# Patient Record
Sex: Female | Born: 1955
Health system: Southern US, Community
[De-identification: ages and names within clinical notes are randomized; demographics above are authoritative.]

## PROBLEM LIST (undated history)

## (undated) DIAGNOSIS — Z720 Tobacco use: Secondary | ICD-10-CM

## (undated) DIAGNOSIS — I10 Essential (primary) hypertension: Secondary | ICD-10-CM

## (undated) DIAGNOSIS — F1910 Other psychoactive substance abuse, uncomplicated: Secondary | ICD-10-CM

## (undated) HISTORY — DX: Tobacco use: Z72.0

## (undated) HISTORY — DX: Essential (primary) hypertension: I10

## (undated) HISTORY — DX: Other psychoactive substance abuse, uncomplicated: F19.10

---

## 2018-01-24 ENCOUNTER — Encounter (HOSPITAL_COMMUNITY): Payer: Self-pay

## 2018-01-24 ENCOUNTER — Emergency Department (HOSPITAL_COMMUNITY)
Admission: EM | Admit: 2018-01-24 | Discharge: 2018-01-24 | Disposition: A | Discharge status: Home / Self Care | Payer: Medicare Other | Attending: Emergency Medicine | Admitting: Emergency Medicine

## 2018-01-24 DIAGNOSIS — F1910 Other psychoactive substance abuse, uncomplicated: Secondary | ICD-10-CM

## 2018-01-24 DIAGNOSIS — I10 Essential (primary) hypertension: Secondary | ICD-10-CM | POA: Diagnosis present

## 2018-01-24 DIAGNOSIS — N179 Acute kidney failure, unspecified: Secondary | ICD-10-CM | POA: Insufficient documentation

## 2018-01-24 DIAGNOSIS — F17200 Nicotine dependence, unspecified, uncomplicated: Secondary | ICD-10-CM | POA: Insufficient documentation

## 2018-01-24 DIAGNOSIS — I159 Secondary hypertension, unspecified: Secondary | ICD-10-CM | POA: Insufficient documentation

## 2018-01-24 LAB — COMPREHENSIVE METABOLIC PANEL
ALK PHOS: 77 U/L (ref 38–126)
ALT: 12 U/L — ABNORMAL LOW (ref 14–54)
ANION GAP: 11 (ref 5–15)
AST: 16 U/L (ref 15–41)
Albumin: 3.9 g/dL (ref 3.5–5.0)
BILIRUBIN TOTAL: 0.6 mg/dL (ref 0.3–1.2)
BUN: 10 mg/dL (ref 6–20)
CO2: 22 mmol/L (ref 22–32)
Calcium: 9.5 mg/dL (ref 8.9–10.3)
Chloride: 107 mmol/L (ref 101–111)
Creatinine, Ser: 1.16 mg/dL — ABNORMAL HIGH (ref 0.44–1.00)
GFR, EST AFRICAN AMERICAN: 57 mL/min — AB (ref >60)
GFR, EST NON AFRICAN AMERICAN: 49 mL/min — AB (ref >60)
Glucose, Bld: 89 mg/dL (ref 65–99)
Potassium: 3.7 mmol/L (ref 3.5–5.1)
SODIUM: 140 mmol/L (ref 135–145)
TOTAL PROTEIN: 7.5 g/dL (ref 6.5–8.1)

## 2018-01-24 LAB — CBC
HCT: 41.7 % (ref 36.0–46.0)
HEMOGLOBIN: 13.5 g/dL (ref 12.0–15.0)
MCH: 29.7 pg (ref 26.0–34.0)
MCHC: 32.4 g/dL (ref 30.0–36.0)
MCV: 91.6 fL (ref 78.0–100.0)
PLATELETS: 257 10*3/uL (ref 150–400)
RBC: 4.55 MIL/uL (ref 3.87–5.11)
RDW: 14 % (ref 11.5–15.5)
WBC: 4.6 10*3/uL (ref 4.0–10.5)

## 2018-01-24 MED ORDER — HYDROCHLOROTHIAZIDE 25 MG PO TABS: 25.0000 mg | ORAL_TABLET | Freq: Every day | ORAL | 0 refills | Status: DC

## 2018-01-24 MED ORDER — HYDROCHLOROTHIAZIDE 25 MG PO TABS
25.0000 mg | ORAL_TABLET | Freq: Every day | ORAL | Status: DC
  Administered 2018-01-24: 25 mg via ORAL
  Filled 2018-01-24: qty 1

## 2018-01-24 NOTE — Discharge Instructions (Signed)
If you do not have a primary care physician then it is very important that you develop a relationship with one.  Please contact [facility] at [phone] for a referral to many excellent primary care physicians in the community.

## 2018-01-24 NOTE — ED Triage Notes (Signed)
Patient states that she was sent here from rehab facility due to HTN. No complaints. States that she uses crack and ETOH daily. No CP, no SOB

## 2018-01-24 NOTE — ED Provider Notes (Signed)
[facility] Provider Note  CSN: [id number] Arrival date & time: 01/24/18 1013  Chief Complaint(s) No chief complaint on file.  HPI [facility] is a 62 y.o. female with a history of polysubstance abuse presents to the emergency department for hypertension.  [facility] after she was trying to be admitted for rehab when they noted the elevated blood pressure.  She denies any known history of hypertension.  She is currently asymptomatic and denies any headache, vision loss, dizziness, chest pain, shortness of breath, trouble voiding.  She denies any recent fevers or infections.  Denies any other physical complaints.  HPI  Past Medical History History reviewed. No pertinent past medical history. There are no active problems to display for this patient.  Home Medication(s) Prior to Admission medications   Medication Sig Start Date End Date Taking? Authorizing Provider  hydrochlorothiazide (HYDRODIURIL) 25 MG tablet Take 1 tablet (25 mg total) by mouth daily. 01/24/18 02/23/18  Fatima Blank, MD                                                                                                                                    Past Surgical History History reviewed. No pertinent surgical history. Family History No family history on file.  Social History Social History   Tobacco Use  . Smoking status: Current Every Day Smoker  . Smokeless tobacco: Never Used  Substance Use Topics  . Alcohol use: Yes  . Drug use: Yes    Types: Cocaine, Marijuana   Allergies Patient has no known allergies.  Review of Systems Review of Systems All other systems are reviewed and are negative for acute change except as noted in the HPI  Physical Exam Vital Signs  I have reviewed the triage vital signs BP (!) 185/95   Pulse 71   Temp 98.3 F (36.8 C) (Oral)   Resp 16   SpO2 98%  All other systems are reviewed and are negative for  acute change except as noted in the HPI  Physical Exam  Constitutional: She is oriented to person, place, and time. She appears well-developed and well-nourished. No distress.  HENT:  Head: Normocephalic and atraumatic.  Nose: Nose normal.  Eyes: Conjunctivae and EOM are normal. Pupils are equal, round, and reactive to light. Right eye exhibits no discharge. Left eye exhibits no discharge. No scleral icterus.  Neck: Normal range of motion. Neck supple.  Cardiovascular: Normal rate. A regularly irregular rhythm present. Exam reveals no gallop and no friction rub.  No murmur heard. Pulmonary/Chest: Effort normal and breath sounds normal. No stridor. No respiratory distress. She has no rales.  Abdominal: Soft. She exhibits no distension. There is no tenderness.  Musculoskeletal: She exhibits no edema or tenderness.  Neurological: She is alert and oriented to person, place, and time.  Skin: Skin is warm and dry. No rash noted. She is not diaphoretic.  No erythema.  Psychiatric: She has a normal mood and affect.  Vitals reviewed.   ED Results and Treatments Labs (all labs ordered are listed, but only abnormal results are displayed) Labs Reviewed  COMPREHENSIVE METABOLIC PANEL - Abnormal; Notable for the following components:      Result Value   Creatinine, Ser 1.16 (*)    ALT 12 (*)    GFR calc non Af Amer 49 (*)    GFR calc Af Amer 57 (*)    All other components within normal limits  CBC                                                                                                                         EKG  EKG Interpretation  Date/Time:  Monday January 24 2018 12:21:08 EST Ventricular Rate:  58 PR Interval:    QRS Duration: 110 QT Interval:  448 QTC Calculation: 440 R Axis:   -65 Text Interpretation:  Sinus rhythm Atrial premature complexes Left atrial enlargement Incomplete RBBB and LAFB NO STEMI No old tracing to compare Confirmed by Addison Lank [phone]) on 01/24/2018  12:39:30 PM      Radiology No results found. Pertinent labs & imaging results that were available during my care of the patient were reviewed by me and considered in my medical decision making (see chart for details).  Medications Ordered in ED Medications  hydrochlorothiazide (HYDRODIURIL) tablet 25 mg (25 mg Oral Given 01/24/18 1219)                                                                                                                                    Procedures Procedures  (including critical care time)  Medical Decision Making / ED Course I have reviewed the nursing notes for this encounter and the patient's prior records (if available in EHR or on provided paperwork).    Asymptomatic hypertension.  Labs drawn in triage revealed mild renal insufficiency.  No priors for comparison.  On exam patient had notable irregularly irregular heart rhythm thus an EKG was obtained revealing frequent PACs.  Otherwise no ischemic changes or acute dysrhythmias.  No evidence suggesting significant endorgan damage from hypertension.  We will start the patient on HCTZ instead of lisinopril given the renal insufficiency.  Patient does have Medicare/Medicaid that she was instructed to establish care with a primary care provider for continued management of her hypertension.  The  patient appears reasonably screened and/or stabilized for discharge and I doubt any other medical condition or other [facility] requiring further screening, evaluation, or treatment in the ED at this time prior to discharge.  The patient is safe for discharge with strict return precautions.   Final Clinical Impression(s) / ED Diagnoses Final diagnoses:  Secondary hypertension  Substance abuse ([place])  AKI (acute kidney injury) ([place])   Disposition: Discharge  Condition: Good  I have discussed the results, Dx and Tx plan with the patient who expressed understanding and agree(s) with the plan. Discharge instructions  discussed at great length. The patient was given strict return precautions who verbalized understanding of the instructions. No further questions at time of discharge.    ED Discharge Orders        Ordered    hydrochlorothiazide (HYDRODIURIL) 25 MG tablet  Daily     01/24/18 1303       Follow Up: Primary care provider   Please establish care for continued management of your hypertension      This chart was dictated using voice recognition software.  Despite best efforts to proofread,  errors can occur which can change the documentation meaning.   Fatima Blank, MD 01/24/18 1304

## 2018-05-30 DIAGNOSIS — F10120 Alcohol abuse with intoxication, uncomplicated: Secondary | ICD-10-CM | POA: Diagnosis not present

## 2018-06-03 DIAGNOSIS — F1020 Alcohol dependence, uncomplicated: Secondary | ICD-10-CM | POA: Diagnosis not present

## 2018-06-03 DIAGNOSIS — I10 Essential (primary) hypertension: Secondary | ICD-10-CM | POA: Diagnosis not present

## 2018-06-03 DIAGNOSIS — F1290 Cannabis use, unspecified, uncomplicated: Secondary | ICD-10-CM | POA: Diagnosis not present

## 2018-06-03 DIAGNOSIS — F1490 Cocaine use, unspecified, uncomplicated: Secondary | ICD-10-CM | POA: Diagnosis not present

## 2018-06-03 DIAGNOSIS — F10120 Alcohol abuse with intoxication, uncomplicated: Secondary | ICD-10-CM | POA: Diagnosis not present

## 2018-06-07 DIAGNOSIS — F1020 Alcohol dependence, uncomplicated: Secondary | ICD-10-CM | POA: Diagnosis not present

## 2018-06-10 DIAGNOSIS — F1020 Alcohol dependence, uncomplicated: Secondary | ICD-10-CM | POA: Diagnosis not present

## 2018-06-13 DIAGNOSIS — F1020 Alcohol dependence, uncomplicated: Secondary | ICD-10-CM | POA: Diagnosis not present

## 2018-06-16 DIAGNOSIS — F10120 Alcohol abuse with intoxication, uncomplicated: Secondary | ICD-10-CM | POA: Diagnosis not present

## 2018-06-20 DIAGNOSIS — F1020 Alcohol dependence, uncomplicated: Secondary | ICD-10-CM | POA: Diagnosis not present

## 2018-06-27 DIAGNOSIS — F10120 Alcohol abuse with intoxication, uncomplicated: Secondary | ICD-10-CM | POA: Diagnosis not present

## 2018-07-01 DIAGNOSIS — F1020 Alcohol dependence, uncomplicated: Secondary | ICD-10-CM | POA: Diagnosis not present

## 2018-07-05 DIAGNOSIS — F10120 Alcohol abuse with intoxication, uncomplicated: Secondary | ICD-10-CM | POA: Diagnosis not present

## 2018-07-08 DIAGNOSIS — F1020 Alcohol dependence, uncomplicated: Secondary | ICD-10-CM | POA: Diagnosis not present

## 2018-07-11 DIAGNOSIS — F1020 Alcohol dependence, uncomplicated: Secondary | ICD-10-CM | POA: Diagnosis not present

## 2018-07-20 DIAGNOSIS — F1020 Alcohol dependence, uncomplicated: Secondary | ICD-10-CM | POA: Diagnosis not present

## 2018-07-22 DIAGNOSIS — F1220 Cannabis dependence, uncomplicated: Secondary | ICD-10-CM | POA: Diagnosis not present

## 2018-07-25 DIAGNOSIS — F1220 Cannabis dependence, uncomplicated: Secondary | ICD-10-CM | POA: Diagnosis not present

## 2018-07-29 DIAGNOSIS — F10120 Alcohol abuse with intoxication, uncomplicated: Secondary | ICD-10-CM | POA: Diagnosis not present

## 2018-07-29 DIAGNOSIS — F12120 Cannabis abuse with intoxication, uncomplicated: Secondary | ICD-10-CM | POA: Diagnosis not present

## 2018-08-01 DIAGNOSIS — F10120 Alcohol abuse with intoxication, uncomplicated: Secondary | ICD-10-CM | POA: Diagnosis not present

## 2018-08-01 DIAGNOSIS — F12120 Cannabis abuse with intoxication, uncomplicated: Secondary | ICD-10-CM | POA: Diagnosis not present

## 2018-08-10 DIAGNOSIS — F1220 Cannabis dependence, uncomplicated: Secondary | ICD-10-CM | POA: Diagnosis not present

## 2018-08-12 DIAGNOSIS — F1220 Cannabis dependence, uncomplicated: Secondary | ICD-10-CM | POA: Diagnosis not present

## 2018-08-12 DIAGNOSIS — F1020 Alcohol dependence, uncomplicated: Secondary | ICD-10-CM | POA: Diagnosis not present

## 2018-08-19 DIAGNOSIS — F1220 Cannabis dependence, uncomplicated: Secondary | ICD-10-CM | POA: Diagnosis not present

## 2018-08-19 DIAGNOSIS — F1020 Alcohol dependence, uncomplicated: Secondary | ICD-10-CM | POA: Diagnosis not present

## 2018-08-22 DIAGNOSIS — F12120 Cannabis abuse with intoxication, uncomplicated: Secondary | ICD-10-CM | POA: Diagnosis not present

## 2018-08-22 DIAGNOSIS — F10120 Alcohol abuse with intoxication, uncomplicated: Secondary | ICD-10-CM | POA: Diagnosis not present

## 2018-08-24 DIAGNOSIS — F1411 Cocaine abuse, in remission: Secondary | ICD-10-CM | POA: Diagnosis not present

## 2018-08-24 DIAGNOSIS — I10 Essential (primary) hypertension: Secondary | ICD-10-CM | POA: Diagnosis not present

## 2018-08-24 DIAGNOSIS — Z1211 Encounter for screening for malignant neoplasm of colon: Secondary | ICD-10-CM | POA: Diagnosis not present

## 2018-08-24 DIAGNOSIS — R634 Abnormal weight loss: Secondary | ICD-10-CM | POA: Diagnosis not present

## 2018-08-24 DIAGNOSIS — Z139 Encounter for screening, unspecified: Secondary | ICD-10-CM | POA: Diagnosis not present

## 2018-08-24 DIAGNOSIS — Z1231 Encounter for screening mammogram for malignant neoplasm of breast: Secondary | ICD-10-CM | POA: Diagnosis not present

## 2018-08-24 DIAGNOSIS — F1220 Cannabis dependence, uncomplicated: Secondary | ICD-10-CM | POA: Diagnosis not present

## 2018-08-24 DIAGNOSIS — Z13228 Encounter for screening for other metabolic disorders: Secondary | ICD-10-CM | POA: Diagnosis not present

## 2018-08-24 DIAGNOSIS — Z13220 Encounter for screening for lipoid disorders: Secondary | ICD-10-CM | POA: Diagnosis not present

## 2018-08-24 DIAGNOSIS — F1421 Cocaine dependence, in remission: Secondary | ICD-10-CM | POA: Diagnosis not present

## 2018-08-24 LAB — HEPATIC FUNCTION PANEL
ALT: 13 (ref 7–35)
AST: 18 (ref 13–35)
Alkaline Phosphatase: 86 (ref 25–125)
Bilirubin, Total: 0.4

## 2018-08-24 LAB — BASIC METABOLIC PANEL
BUN: 14 (ref 4–21)
Creatinine: 1 (ref 0.5–1.1)
GLUCOSE: 88
Potassium: 4.5 (ref 3.4–5.3)
SODIUM: 141 (ref 137–147)

## 2018-08-24 LAB — CBC AND DIFFERENTIAL
HEMATOCRIT: 41 (ref 36–46)
HEMOGLOBIN: 13.5 (ref 12.0–16.0)
Platelets: 286 (ref 150–399)
WBC: 4.4

## 2018-08-24 LAB — HEMOGLOBIN A1C: Hemoglobin A1C: 5.9

## 2018-08-24 LAB — LIPID PANEL
CHOLESTEROL: 222 — AB (ref 0–200)
HDL: 70 (ref 35–70)
LDL Cholesterol: 127
LDL/HDL RATIO: 1.8
TRIGLYCERIDES: 127 (ref 40–160)

## 2018-08-24 LAB — TSH: TSH: 1.18 (ref 0.41–5.90)

## 2018-08-26 DIAGNOSIS — F1220 Cannabis dependence, uncomplicated: Secondary | ICD-10-CM | POA: Diagnosis not present

## 2018-09-05 DIAGNOSIS — F1020 Alcohol dependence, uncomplicated: Secondary | ICD-10-CM | POA: Diagnosis not present

## 2018-09-09 DIAGNOSIS — F10120 Alcohol abuse with intoxication, uncomplicated: Secondary | ICD-10-CM | POA: Diagnosis not present

## 2018-09-09 DIAGNOSIS — F12120 Cannabis abuse with intoxication, uncomplicated: Secondary | ICD-10-CM | POA: Diagnosis not present

## 2018-09-14 ENCOUNTER — Encounter: Payer: Self-pay | Admitting: Nurse Practitioner

## 2018-09-14 DIAGNOSIS — Z1211 Encounter for screening for malignant neoplasm of colon: Secondary | ICD-10-CM | POA: Diagnosis not present

## 2018-09-14 DIAGNOSIS — R634 Abnormal weight loss: Secondary | ICD-10-CM | POA: Diagnosis not present

## 2018-09-14 DIAGNOSIS — I4510 Unspecified right bundle-branch block: Secondary | ICD-10-CM | POA: Diagnosis not present

## 2018-09-14 DIAGNOSIS — F1411 Cocaine abuse, in remission: Secondary | ICD-10-CM

## 2018-09-14 DIAGNOSIS — R630 Anorexia: Secondary | ICD-10-CM | POA: Diagnosis not present

## 2018-09-14 DIAGNOSIS — Z13228 Encounter for screening for other metabolic disorders: Secondary | ICD-10-CM

## 2018-09-14 DIAGNOSIS — Z13220 Encounter for screening for lipoid disorders: Secondary | ICD-10-CM

## 2018-09-14 DIAGNOSIS — Z1231 Encounter for screening mammogram for malignant neoplasm of breast: Secondary | ICD-10-CM

## 2018-09-14 DIAGNOSIS — I10 Essential (primary) hypertension: Secondary | ICD-10-CM | POA: Diagnosis not present

## 2018-09-14 DIAGNOSIS — Z720 Tobacco use: Secondary | ICD-10-CM

## 2018-09-14 DIAGNOSIS — F1220 Cannabis dependence, uncomplicated: Secondary | ICD-10-CM

## 2018-09-16 DIAGNOSIS — F12120 Cannabis abuse with intoxication, uncomplicated: Secondary | ICD-10-CM | POA: Diagnosis not present

## 2018-09-16 DIAGNOSIS — F10120 Alcohol abuse with intoxication, uncomplicated: Secondary | ICD-10-CM | POA: Diagnosis not present

## 2018-09-19 DIAGNOSIS — F12120 Cannabis abuse with intoxication, uncomplicated: Secondary | ICD-10-CM | POA: Diagnosis not present

## 2018-09-19 DIAGNOSIS — F10120 Alcohol abuse with intoxication, uncomplicated: Secondary | ICD-10-CM | POA: Diagnosis not present

## 2018-09-22 DIAGNOSIS — F1220 Cannabis dependence, uncomplicated: Secondary | ICD-10-CM | POA: Diagnosis not present

## 2018-09-22 DIAGNOSIS — F1020 Alcohol dependence, uncomplicated: Secondary | ICD-10-CM | POA: Diagnosis not present

## 2018-09-26 DIAGNOSIS — F1220 Cannabis dependence, uncomplicated: Secondary | ICD-10-CM | POA: Diagnosis not present

## 2018-09-26 DIAGNOSIS — F1020 Alcohol dependence, uncomplicated: Secondary | ICD-10-CM | POA: Diagnosis not present

## 2018-09-27 ENCOUNTER — Encounter: Payer: Self-pay | Admitting: Nurse Practitioner

## 2018-09-27 ENCOUNTER — Ambulatory Visit (INDEPENDENT_AMBULATORY_CARE_PROVIDER_SITE_OTHER): Payer: Medicare HMO | Admitting: Nurse Practitioner

## 2018-09-27 VITALS — BP 124/80 | HR 59 | Temp 98.1°F | Ht 69.0 in | Wt 134.0 lb

## 2018-09-27 DIAGNOSIS — I499 Cardiac arrhythmia, unspecified: Secondary | ICD-10-CM | POA: Diagnosis not present

## 2018-09-27 DIAGNOSIS — I10 Essential (primary) hypertension: Secondary | ICD-10-CM | POA: Diagnosis not present

## 2018-09-27 MED ORDER — ASPIRIN 81 MG PO CHEW: 81.0000 mg | CHEWABLE_TABLET | Freq: Every day | ORAL | 11 refills | Status: DC

## 2018-09-27 NOTE — Patient Instructions (Signed)
DASH Eating Plan DASH stands for "Dietary Approaches to Stop Hypertension." The DASH eating plan is a healthy eating plan that has been shown to reduce high blood pressure (hypertension). It may also reduce your risk for type 2 diabetes, heart disease, and stroke. The DASH eating plan may also help with weight loss. What are tips for following this plan? General guidelines  Avoid eating more than 2,300 mg (milligrams) of salt (sodium) a day. If you have hypertension, you may need to reduce your sodium intake to 1,500 mg a day.  Limit alcohol intake to no more than 1 drink a day for nonpregnant women and 2 drinks a day for men. One drink equals 12 oz of beer, 5 oz of wine, or 1 oz of hard liquor.  Work with your health care provider to maintain a healthy body weight or to lose weight. Ask what an ideal weight is for you.  Get at least 30 minutes of exercise that causes your heart to beat faster (aerobic exercise) most days of the week. Activities may include walking, swimming, or biking.  Work with your health care provider or diet and nutrition specialist (dietitian) to adjust your eating plan to your individual calorie needs. Reading food labels  Check food labels for the amount of sodium per serving. Choose foods with less than 5 percent of the Daily Value of sodium. Generally, foods with less than 300 mg of sodium per serving fit into this eating plan.  To find whole grains, look for the word "whole" as the first word in the ingredient list. Shopping  Buy products labeled as "low-sodium" or "no salt added."  Buy fresh foods. Avoid canned foods and premade or frozen meals. Cooking  Avoid adding salt when cooking. Use salt-free seasonings or herbs instead of table salt or sea salt. Check with your health care provider or pharmacist before using salt substitutes.  Do not fry foods. Cook foods using healthy methods such as baking, boiling, grilling, and broiling instead.  Cook with  heart-healthy oils, such as olive, canola, soybean, or sunflower oil. Meal planning   Eat a balanced diet that includes: ? 5 or more servings of fruits and vegetables each day. At each meal, try to fill half of your plate with fruits and vegetables. ? Up to 6-8 servings of whole grains each day. ? Less than 6 oz of lean meat, poultry, or fish each day. A 3-oz serving of meat is about the same size as a deck of cards. One egg equals 1 oz. ? 2 servings of low-fat dairy each day. ? A serving of nuts, seeds, or beans 5 times each week. ? Heart-healthy fats. Healthy fats called Omega-3 fatty acids are found in foods such as flaxseeds and coldwater fish, like sardines, salmon, and mackerel.  Limit how much you eat of the following: ? Canned or prepackaged foods. ? Food that is high in trans fat, such as fried foods. ? Food that is high in saturated fat, such as fatty meat. ? Sweets, desserts, sugary drinks, and other foods with added sugar. ? Full-fat dairy products.  Do not salt foods before eating.  Try to eat at least 2 vegetarian meals each week.  Eat more home-cooked food and less restaurant, buffet, and fast food.  When eating at a restaurant, ask that your food be prepared with less salt or no salt, if possible. What foods are recommended? The items listed may not be a complete list. Talk with your dietitian about what   dietary choices are best for you. Grains Whole-grain or whole-wheat bread. Whole-grain or whole-wheat pasta. Brown rice. Oatmeal. Quinoa. Bulgur. Whole-grain and low-sodium cereals. Pita bread. Low-fat, low-sodium crackers. Whole-wheat flour tortillas. Vegetables Fresh or frozen vegetables (raw, steamed, roasted, or grilled). Low-sodium or reduced-sodium tomato and vegetable juice. Low-sodium or reduced-sodium tomato sauce and tomato paste. Low-sodium or reduced-sodium canned vegetables. Fruits All fresh, dried, or frozen fruit. Canned fruit in natural juice (without  added sugar). Meat and other protein foods Skinless chicken or turkey. Ground chicken or turkey. Pork with fat trimmed off. Fish and seafood. Egg whites. Dried beans, peas, or lentils. Unsalted nuts, nut butters, and seeds. Unsalted canned beans. Lean cuts of beef with fat trimmed off. Low-sodium, lean deli meat. Dairy Low-fat (1%) or fat-free (skim) milk. Fat-free, low-fat, or reduced-fat cheeses. Nonfat, low-sodium ricotta or cottage cheese. Low-fat or nonfat yogurt. Low-fat, low-sodium cheese. Fats and oils Soft margarine without trans fats. Vegetable oil. Low-fat, reduced-fat, or light mayonnaise and salad dressings (reduced-sodium). Canola, safflower, olive, soybean, and sunflower oils. Avocado. Seasoning and other foods Herbs. Spices. Seasoning mixes without salt. Unsalted popcorn and pretzels. Fat-free sweets. What foods are not recommended? The items listed may not be a complete list. Talk with your dietitian about what dietary choices are best for you. Grains Baked goods made with fat, such as croissants, muffins, or some breads. Dry pasta or rice meal packs. Vegetables Creamed or fried vegetables. Vegetables in a cheese sauce. Regular canned vegetables (not low-sodium or reduced-sodium). Regular canned tomato sauce and paste (not low-sodium or reduced-sodium). Regular tomato and vegetable juice (not low-sodium or reduced-sodium). Pickles. Olives. Fruits Canned fruit in a light or heavy syrup. Fried fruit. Fruit in cream or butter sauce. Meat and other protein foods Fatty cuts of meat. Ribs. Fried meat. Bacon. Sausage. Bologna and other processed lunch meats. Salami. Fatback. Hotdogs. Bratwurst. Salted nuts and seeds. Canned beans with added salt. Canned or smoked fish. Whole eggs or egg yolks. Chicken or turkey with skin. Dairy Whole or 2% milk, cream, and half-and-half. Whole or full-fat cream cheese. Whole-fat or sweetened yogurt. Full-fat cheese. Nondairy creamers. Whipped toppings.  Processed cheese and cheese spreads. Fats and oils Butter. Stick margarine. Lard. Shortening. Ghee. Bacon fat. Tropical oils, such as coconut, palm kernel, or palm oil. Seasoning and other foods Salted popcorn and pretzels. Onion salt, garlic salt, seasoned salt, table salt, and sea salt. Worcestershire sauce. Tartar sauce. Barbecue sauce. Teriyaki sauce. Soy sauce, including reduced-sodium. Steak sauce. Canned and packaged gravies. Fish sauce. Oyster sauce. Cocktail sauce. Horseradish that you find on the shelf. Ketchup. Mustard. Meat flavorings and tenderizers. Bouillon cubes. Hot sauce and Tabasco sauce. Premade or packaged marinades. Premade or packaged taco seasonings. Relishes. Regular salad dressings. Where to find more information:  National Heart, Lung, and Blood Institute: www.nhlbi.nih.gov  American Heart Association: www.heart.org Summary  The DASH eating plan is a healthy eating plan that has been shown to reduce high blood pressure (hypertension). It may also reduce your risk for type 2 diabetes, heart disease, and stroke.  With the DASH eating plan, you should limit salt (sodium) intake to 2,300 mg a day. If you have hypertension, you may need to reduce your sodium intake to 1,500 mg a day.  When on the DASH eating plan, aim to eat more fresh fruits and vegetables, whole grains, lean proteins, low-fat dairy, and heart-healthy fats.  Work with your health care provider or diet and nutrition specialist (dietitian) to adjust your eating plan to your individual   calorie needs. This information is not intended to replace advice given to you by your health care provider. Make sure you discuss any questions you have with your health care provider. Document Released: 12/03/2011 Document Revised: 12/07/2016 Document Reviewed: 12/07/2016 Elsevier Interactive Patient Education  2018 Elsevier Inc.  

## 2018-09-27 NOTE — Progress Notes (Signed)
   Subjective:    Patient ID: Deanna [facility], female    DOB: 1956-08-29, 62 y.o.   MRN: [id number]  Hypertension  This is a chronic problem. The problem has been gradually improving since onset. The problem is controlled. There are no associated agents to hypertension. Risk factors for coronary artery disease include smoking/tobacco exposure and sedentary lifestyle. The current treatment provides significant improvement. There are no compliance problems.       Review of Systems  Constitutional: Negative.   Eyes: Negative.   Respiratory: Negative.   Cardiovascular: Negative.   Neurological: Negative.   Psychiatric/Behavioral: Negative.        Objective:   Physical Exam  Constitutional: She appears well-developed and well-nourished.  Cardiovascular: Normal heart sounds and normal pulses. An irregular rhythm present.  Pulmonary/Chest: Effort normal and breath sounds normal.  Musculoskeletal: Normal range of motion.  Skin: Skin is warm.          Assessment & Plan:  Hypertension - chronic, controlled, continue current medications. She has an appointment with Cardiology.   Irregular Heart beat - she has an appointment with Cardiology this month for further evaluation.  Will advise her to take a baby aspirin 81mg  daily.

## 2018-09-28 ENCOUNTER — Ambulatory Visit: Payer: Medicare HMO | Admitting: Nurse Practitioner

## 2018-09-30 DIAGNOSIS — F10120 Alcohol abuse with intoxication, uncomplicated: Secondary | ICD-10-CM | POA: Diagnosis not present

## 2018-09-30 DIAGNOSIS — F12120 Cannabis abuse with intoxication, uncomplicated: Secondary | ICD-10-CM | POA: Diagnosis not present

## 2018-10-03 DIAGNOSIS — F1220 Cannabis dependence, uncomplicated: Secondary | ICD-10-CM | POA: Diagnosis not present

## 2018-10-03 DIAGNOSIS — F1020 Alcohol dependence, uncomplicated: Secondary | ICD-10-CM | POA: Diagnosis not present

## 2018-10-07 DIAGNOSIS — F10120 Alcohol abuse with intoxication, uncomplicated: Secondary | ICD-10-CM | POA: Diagnosis not present

## 2018-10-07 DIAGNOSIS — F12120 Cannabis abuse with intoxication, uncomplicated: Secondary | ICD-10-CM | POA: Diagnosis not present

## 2018-10-19 ENCOUNTER — Ambulatory Visit: Payer: Medicare HMO

## 2018-10-19 ENCOUNTER — Ambulatory Visit (INDEPENDENT_AMBULATORY_CARE_PROVIDER_SITE_OTHER): Payer: Medicare HMO | Admitting: Nurse Practitioner

## 2018-10-19 VITALS — BP 118/84 | HR 66 | Temp 97.6°F | Ht 67.5 in | Wt 133.6 lb

## 2018-10-19 DIAGNOSIS — R0789 Other chest pain: Secondary | ICD-10-CM

## 2018-10-19 DIAGNOSIS — Z0000 Encounter for general adult medical examination without abnormal findings: Secondary | ICD-10-CM | POA: Diagnosis not present

## 2018-10-19 DIAGNOSIS — R071 Chest pain on breathing: Secondary | ICD-10-CM

## 2018-10-19 MED ORDER — PREDNISONE 20 MG PO TABS: ORAL_TABLET | ORAL | 0 refills | Status: DC

## 2018-10-19 NOTE — Progress Notes (Signed)
  Subjective:     Patient ID: Deanna Harrell , female    DOB: 1956/10/31 , 62 y.o.   MRN: [id number]   Chest Pain   This is a new problem. The current episode started yesterday. The onset quality is sudden. The problem occurs constantly. The pain is present in the substernal region. The pain is at a severity of 2/10. The pain is mild. The quality of the pain is described as tightness. The pain does not radiate. Pertinent negatives include no abdominal pain, cough, dizziness, malaise/fatigue, palpitations, shortness of breath or sputum production. The pain is aggravated by deep breathing. She has tried rest for the symptoms. Risk factors include sedentary lifestyle, obesity and substance abuse (smoked marijuana yesterday). Prior diagnostic workup includes stress echo.     Past Medical History:  Diagnosis Date  . Hypertension       Current Outpatient Medications:  .  amLODipine (NORVASC) 2.5 MG tablet, Take 2.5 mg by mouth daily., Disp: , Rfl:  .  aspirin (ASPIRIN CHILDRENS) 81 MG chewable tablet, Chew 1 tablet (81 mg total) by mouth daily. (Patient not taking: Reported on 10/19/2018), Disp: 36 tablet, Rfl: 11 .  hydrochlorothiazide (HYDRODIURIL) 25 MG tablet, Take 1 tablet (25 mg total) by mouth daily., Disp: 30 tablet, Rfl: 0   No Known Allergies   Review of Systems  Constitutional: Negative for malaise/fatigue.  Respiratory: Negative for cough, sputum production and shortness of breath.   Cardiovascular: Positive for chest pain. Negative for palpitations.  Gastrointestinal: Negative for abdominal pain.  Neurological: Negative for dizziness.     There were no vitals filed for this visit. There is no height or weight on file to calculate BMI.   Objective:  Physical Exam  Constitutional: She appears well-developed and well-nourished.  Cardiovascular: Normal rate, regular rhythm, normal heart sounds and intact distal pulses.  Pulmonary/Chest: Effort normal and breath sounds normal.   Musculoskeletal:  Left 5 and 6th rib spaces with tenderness   Skin: Skin is warm and dry.        Assessment And Plan:     1. Costochondral chest pain  Tender to left 5th and 6th rib spaces  May use heating pad on low two times per day.  - predniSONE (DELTASONE) 20 MG tablet; Take 2 tabs by mouth daily  Dispense: 20 tablet; Refill: 0       Minette Brine, FNP

## 2018-10-19 NOTE — Patient Instructions (Signed)
Costochondritis Costochondritis is swelling and irritation (inflammation) of the tissue (cartilage) that connects your ribs to your breastbone (sternum). This causes pain in the front of your chest. Usually, the pain:  Starts gradually.  Is in more than one rib.  This condition usually goes away on its own over time. Follow these instructions at home:  Do not do anything that makes your pain worse.  If directed, put ice on the painful area: ? Put ice in a plastic bag. ? Place a towel between your skin and the bag. ? Leave the ice on for 20 minutes, 2-3 times a day.  If directed, put heat on the affected area as often as told by your doctor. Use the heat source that your doctor tells you to use, such as a moist heat pack or a heating pad. ? Place a towel between your skin and the heat source. ? Leave the heat on for 20-30 minutes. ? Take off the heat if your skin turns bright red. This is very important if you cannot feel pain, heat, or cold. You may have a greater risk of getting burned.  Take over-the-counter and prescription medicines only as told by your doctor.  Return to your normal activities as told by your doctor. Ask your doctor what activities are safe for you.  Keep all follow-up visits as told by your doctor. This is important. Contact a doctor if:  You have chills or a fever.  Your pain does not go away or it gets worse.  You have a cough that does not go away. Get help right away if:  You are short of breath. This information is not intended to replace advice given to you by your health care provider. Make sure you discuss any questions you have with your health care provider. Document Released: 06/01/2008 Document Revised: 07/03/2016 Document Reviewed: 04/08/2016 Elsevier Interactive Patient Education  2018 [employer] Inc.  

## 2018-10-19 NOTE — Patient Instructions (Addendum)
Deanna Harrell , Thank you for taking time to come for your Medicare Wellness Visit. I appreciate your ongoing commitment to your health goals. Please review the following plan we discussed and let me know if I can assist you in the future.   Screening recommendations/referrals: Colonoscopy: declines Mammogram: declines Bone Density: declines Recommended yearly ophthalmology/optometry visit for glaucoma screening and checkup Recommended yearly dental visit for hygiene and checkup  Vaccinations: Influenza vaccine: declines Pneumococcal vaccine: declines Tdap vaccine: declines Shingles vaccine: declines    Advanced directives: Advance directive discussed with you today. Even though you declined this today please call our office should you change your mind and we can give you the proper paperwork for you to fill out.   Conditions/risks identified: Chest pain: States started yesterday. Hurts more when you press on it.  Describes as a cramping pain. Scheduled to see PCP after this visit.  Next appointment: 12/29/2018 at [facility] [facility] 40-64 Years, Female Preventive care refers to lifestyle choices and visits with your health care provider that can promote health and wellness. What does preventive care include?  A yearly physical exam. This is also called an annual well check.  Dental exams once or twice a year.  Routine eye exams. Ask your health care provider how often you should have your eyes checked.  Personal lifestyle choices, including:  Daily care of your teeth and gums.  Regular physical activity.  Eating a healthy diet.  Avoiding tobacco and drug use.  Limiting alcohol use.  Practicing safe sex.  Taking low-dose aspirin daily starting at age 92.  Taking vitamin and mineral supplements as recommended by your health care provider. What happens during an annual well check? The services and screenings done by your health care provider during your annual well  check will depend on your age, overall health, lifestyle risk factors, and family history of disease. Counseling  Your health care provider may ask you questions about your:  Alcohol use.  Tobacco use.  Drug use.  Emotional well-being.  Home and relationship well-being.  Sexual activity.  Eating habits.  Work and work Statistician.  Method of birth control.  Menstrual cycle.  Pregnancy history. Screening  You may have the following tests or measurements:  Height, weight, and BMI.  Blood pressure.  Lipid and cholesterol levels. These may be checked every 5 years, or more frequently if you are over 66 years old.  Skin check.  Lung cancer screening. You may have this screening every year starting at age 76 if you have a 30-pack-year history of smoking and currently smoke or have quit within the past 15 years.  Fecal occult blood test (FOBT) of the stool. You may have this test every year starting at age 52.  Flexible sigmoidoscopy or colonoscopy. You may have a sigmoidoscopy every 5 years or a colonoscopy every 10 years starting at age 5.  Hepatitis C blood test.  Hepatitis B blood test.  Sexually transmitted disease (STD) testing.  Diabetes screening. This is done by checking your blood sugar (glucose) after you have not eaten for a while (fasting). You may have this done every 1-3 years.  Mammogram. This may be done every 1-2 years. Talk to your health care provider about when you should start having regular mammograms. This may depend on whether you have a family history of breast cancer.  BRCA-related cancer screening. This may be done if you have a family history of breast, ovarian, tubal, or peritoneal cancers.  Pelvic exam and  Pap test. This may be done every 3 years starting at age 69. Starting at age 78, this may be done every 5 years if you have a Pap test in combination with an HPV test.  Bone density scan. This is done to screen for osteoporosis. You  may have this scan if you are at high risk for osteoporosis. Discuss your test results, treatment options, and if necessary, the need for more tests with your health care provider. Vaccines  Your health care provider may recommend certain vaccines, such as:  Influenza vaccine. This is recommended every year.  Tetanus, diphtheria, and acellular pertussis (Tdap, Td) vaccine. You may need a Td booster every 10 years.  Zoster vaccine. You may need this after age 82.  Pneumococcal 13-valent conjugate (PCV13) vaccine. You may need this if you have certain conditions and were not previously vaccinated.  Pneumococcal polysaccharide (PPSV23) vaccine. You may need one or two doses if you smoke cigarettes or if you have certain conditions. Talk to your health care provider about which screenings and vaccines you need and how often you need them. This information is not intended to replace advice given to you by your health care provider. Make sure you discuss any questions you have with your health care provider. Document Released: 01/10/2016 Document Revised: 09/02/2016 Document Reviewed: 10/15/2015 Elsevier Interactive Patient Education  2017 [place] Prevention in the Home Falls can cause injuries. They can happen to people of all ages. There are many things you can do to make your home safe and to help prevent falls. What can I do on the outside of my home?  Regularly fix the edges of walkways and driveways and fix any cracks.  Remove anything that might make you trip as you walk through a door, such as a raised step or threshold.  Trim any bushes or trees on the path to your home.  Use bright outdoor lighting.  Clear any walking paths of anything that might make someone trip, such as rocks or tools.  Regularly check to see if handrails are loose or broken. Make sure that both sides of any steps have handrails.  Any raised decks and porches should have guardrails on the  edges.  Have any leaves, snow, or ice cleared regularly.  Use sand or salt on walking paths during winter.  Clean up any spills in your garage right away. This includes oil or grease spills. What can I do in the bathroom?  Use night lights.  Install grab bars by the toilet and in the tub and shower. Do not use towel bars as grab bars.  Use non-skid mats or decals in the tub or shower.  If you need to sit down in the shower, use a plastic, non-slip stool.  Keep the floor dry. Clean up any water that spills on the floor as soon as it happens.  Remove soap buildup in the tub or shower regularly.  Attach bath mats securely with double-sided non-slip rug tape.  Do not have throw rugs and other things on the floor that can make you trip. What can I do in the bedroom?  Use night lights.  Make sure that you have a light by your bed that is easy to reach.  Do not use any sheets or blankets that are too big for your bed. They should not hang down onto the floor.  Have a firm chair that has side arms. You can use this for support while you  get dressed.  Do not have throw rugs and other things on the floor that can make you trip. What can I do in the kitchen?  Clean up any spills right away.  Avoid walking on wet floors.  Keep items that you use a lot in easy-to-reach places.  If you need to reach something above you, use a strong step stool that has a grab bar.  Keep electrical cords out of the way.  Do not use floor polish or wax that makes floors slippery. If you must use wax, use non-skid floor wax.  Do not have throw rugs and other things on the floor that can make you trip. What can I do with my stairs?  Do not leave any items on the stairs.  Make sure that there are handrails on both sides of the stairs and use them. Fix handrails that are broken or loose. Make sure that handrails are as long as the stairways.  Check any carpeting to make sure that it is firmly  attached to the stairs. Fix any carpet that is loose or worn.  Avoid having throw rugs at the top or bottom of the stairs. If you do have throw rugs, attach them to the floor with carpet tape.  Make sure that you have a light switch at the top of the stairs and the bottom of the stairs. If you do not have them, ask someone to add them for you. What else can I do to help prevent falls?  Wear shoes that:  Do not have high heels.  Have rubber bottoms.  Are comfortable and fit you well.  Are closed at the toe. Do not wear sandals.  If you use a stepladder:  Make sure that it is fully opened. Do not climb a closed stepladder.  Make sure that both sides of the stepladder are locked into place.  Ask someone to hold it for you, if possible.  Clearly mark and make sure that you can see:  Any grab bars or handrails.  First and last steps.  Where the edge of each step is.  Use tools that help you move around (mobility aids) if they are needed. These include:  Canes.  Walkers.  Scooters.  Crutches.  Turn on the lights when you go into a dark area. Replace any light bulbs as soon as they burn out.  Set up your furniture so you have a clear path. Avoid moving your furniture around.  If any of your floors are uneven, fix them.  If there are any pets around you, be aware of where they are.  Review your medicines with your doctor. Some medicines can make you feel dizzy. This can increase your chance of falling. Ask your doctor what other things that you can do to help prevent falls. This information is not intended to replace advice given to you by your health care provider. Make sure you discuss any questions you have with your health care provider. Document Released: 10/10/2009 Document Revised: 05/21/2016 Document Reviewed: 01/18/2015 Elsevier Interactive Patient Education  2017 [employer].

## 2018-10-19 NOTE — Progress Notes (Signed)
Subjective:   Deanna Harrell is a 62 y.o. female who presents for an Initial Medicare Annual Wellness Visit.  Review of Systems    n/a  Cardiac Risk Factors include: smoking/ tobacco exposure;hypertension;sedentary lifestyle     Objective:    Today's Vitals   10/19/18 1526 10/19/18 1528  BP: 118/84   Pulse: 66   Temp: 97.6 F (36.4 C)   SpO2: 99%   Weight: 133 lb 9.6 oz (60.6 kg)   Height: 5' 7.5" (1.715 m)   PainSc:  2    Body mass index is 20.62 kg/m.  Advanced Directives 10/19/2018 01/24/2018  Does Patient Have a Medical Advance Directive? No No  Would patient like information on creating a medical advance directive? No - Patient declined No - Patient declined    Current Medications (verified) Outpatient Encounter Medications as of 10/19/2018  Medication Sig  . amLODipine (NORVASC) 2.5 MG tablet Take 2.5 mg by mouth daily.  Marland Kitchen aspirin (ASPIRIN CHILDRENS) 81 MG chewable tablet Chew 1 tablet (81 mg total) by mouth daily. (Patient not taking: Reported on 10/19/2018)  . hydrochlorothiazide (HYDRODIURIL) 25 MG tablet Take 1 tablet (25 mg total) by mouth daily.   No facility-administered encounter medications on file as of 10/19/2018.     Allergies (verified) Patient has no known allergies.   History: Past Medical History:  Diagnosis Date  . Hypertension    History reviewed. No pertinent surgical history. Family History  Problem Relation Age of Onset  . Diabetes Mother   . Hypertension Mother   . Stroke Mother   . Hypertension Sister   . Hypertension Brother    Social History   Socioeconomic History  . Marital status: Single    Spouse name: Not on file  . Number of children: Not on file  . Years of education: Not on file  . Highest education level: Not on file  Occupational History  . Occupation: disability  Social Needs  . Financial resource strain: Not very hard  . Food insecurity:    Worry: Never true    Inability: Never true  . Transportation  needs:    Medical: No    Non-medical: No  Tobacco Use  . Smoking status: Current Every Day Smoker    Packs/day: 0.50    Types: Cigarettes  . Smokeless tobacco: Never Used  Substance and Sexual Activity  . Alcohol use: Not Currently  . Drug use: Yes    Types: Marijuana  . Sexual activity: Not Currently  Lifestyle  . Physical activity:    Days per week: 0 days    Minutes per session: 0 min  . Stress: To some extent  Relationships  . Social connections:    Talks on phone: Not on file    Gets together: Not on file    Attends religious service: Not on file    Active member of club or organization: Not on file    Attends meetings of clubs or organizations: Not on file    Relationship status: Not on file  Other Topics Concern  . Not on file  Social History Narrative  . Not on file    Tobacco Counseling Ready to quit: Yes Counseling given: Yes   Clinical Intake:  Pre-visit preparation completed: Yes  Pain : 0-10 Pain Score: 2  Pain Type: Acute pain Pain Location: Chest Pain Orientation: Left Pain Radiating Towards: only hurts if you touch it Pain Descriptors / Indicators: Cramping Pain Onset: Yesterday Pain Frequency: Constant Pain Relieving Factors: have  not tried anything Effect of Pain on Daily Activities: none  Pain Relieving Factors: have not tried anything  Nutritional Status: BMI of 19-24  Normal Diabetes: No  How often do you need to have someone help you when you read instructions, pamphlets, or other written materials from your doctor or pharmacy?: 1 - Never What is the last grade level you completed in school?: some college  Interpreter Needed?: No  Information entered by :: NAllen LPN   Activities of Daily Living In your present state of health, do you have any difficulty performing the following activities: 10/19/2018  Hearing? N  Vision? N  Difficulty concentrating or making decisions? Y  Comment Has short term memory problems  Walking or  climbing stairs? N  Dressing or bathing? N  Preparing Food and eating ? N  Using the Toilet? N  In the past six months, have you accidently leaked urine? N  Do you have problems with loss of bowel control? N  Managing your Medications? N  Managing your Finances? N  Housekeeping or managing your Housekeeping? N     Immunizations and Health Maintenance  There is no immunization history on file for this patient. Health Maintenance Due  Topic Date Due  . Hepatitis C Screening  01-31-1956  . HIV Screening  08/04/1970  . PAP SMEAR  08/04/1976    Patient Care Team: Minette Brine, FNP as PCP - General (General Practice)  Indicate any recent Medical Services you may have received from other than [facility] providers in the past year (date may be approximate).     Assessment:   This is a routine wellness examination for Deanna Harrell.  Hearing/Vision screen  Visual Acuity Screening   Right eye Left eye Both eyes  Without correction: 20/50 20/50 20/50   With correction:     Comments: Does not have eyes checked regularly   Dietary issues and exercise activities discussed: Current Exercise Habits: The patient does not participate in regular exercise at present, Exercise limited by: None identified  Goals   None    Depression Screen PHQ 2/9 Scores 10/19/2018  PHQ - 2 Score 0  PHQ- 9 Score 9    Fall Risk Fall Risk  10/19/2018  Falls in the past year? No  Risk for fall due to : Medication side effect    Is the patient's home free of loose throw rugs in walkways, pet beds, electrical cords, etc?   yes      Grab bars in the bathroom? no      Handrails on the stairs?   yes      Adequate lighting?   yes  Timed Get Up and Go Performed n/a  Cognitive Function:     6CIT Screen 10/19/2018  What Year? 0 points  What month? 0 points  What time? 0 points  Count back from 20 0 points  Months in reverse 0 points  Repeat phrase 0 points  Total Score 0    Screening Tests Health  Maintenance  Topic Date Due  . Hepatitis C Screening  [birth year]  . HIV Screening  08/04/1970  . PAP SMEAR  08/04/1976  . INFLUENZA VACCINE  10/20/2019 (Originally 07/28/2018)  . MAMMOGRAM  10/20/2019 (Originally 08/04/2005)  . COLONOSCOPY  10/20/2019 (Originally 08/04/2005)  . TETANUS/TDAP  10/20/2019 (Originally 08/04/1974)    Qualifies for Shingles Vaccine? yes  Cancer Screenings: Lung: Low Dose CT Chest recommended if Age 110-80 years, 30 pack-year currently smoking OR have quit w/in 15years. Patient does qualify. Breast: Up  to date on Mammogram? Yes   Up to date of Bone Density/Dexa? Yes Colorectal: declines  Additional Screenings:  Hepatitis C Screening: n/a     Plan:    Patient is complaining of a cramping chest pain. States it hurts more when you press on it. Seeing PCP after this visit.   I have personally reviewed and noted the following in the patient's chart:   . Medical and social history . Use of alcohol, tobacco or illicit drugs  . Current medications and supplements . Functional ability and status . Nutritional status . Physical activity . Advanced directives . List of other physicians . Hospitalizations, surgeries, and ER visits in previous 12 months . Vitals . Screenings to include cognitive, depression, and falls . Referrals and appointments  In addition, I have reviewed and discussed with patient certain preventive protocols, quality metrics, and best practice recommendations. A written personalized care plan for preventive services as well as general preventive health recommendations were provided to patient.     Kellie Simmering, LPN   79/39/0300

## 2018-11-22 NOTE — Progress Notes (Signed)
Chief Complaint  Patient presents with  . New Patient (Initial Visit)   History of Present Illness: 62 yo female with history of HTN here today as a new consult, referred by Minette Brine, FNP for evaluation of hypertension. She was seen in primary care 10/19/18 and c/o substernal chest pain, worsened with deep breaths. Her pain was felt to be musculoskeletal. She was given steroids. Her chest pain resolved and has not recurred. She is very active. She was noted to have an elevated blood pressure (140/82) and was started on amlodipine when in primary care. Her HCTZ was increased. Her blood pressure had been up to the 200s several weeks before. EKG in the primary care office showed sinus rhythm with PACs. She tells me today that she has no chest pain, dyspnea, dizziness, palpitations, lower extremity edema. She feels well overall. She smokes 5-6 cigarettes per day. She has been smoking for 47 years. She has no known heart disease.   Primary Care Physician: Minette Brine, FNP   Past Medical History:  Diagnosis Date  . Hypertension   . Tobacco abuse     Past Surgical History: Tooth surgery  Current Outpatient Medications  Medication Sig Dispense Refill  . amLODipine (NORVASC) 2.5 MG tablet Take 2.5 mg by mouth daily.    . predniSONE (DELTASONE) 20 MG tablet Take 2 tabs by mouth daily 20 tablet 0  . hydrochlorothiazide (HYDRODIURIL) 25 MG tablet Take 1 tablet (25 mg total) by mouth daily. 30 tablet 0   No current facility-administered medications for this visit.     No Known Allergies  Social History   Socioeconomic History  . Marital status: Single    Spouse name: Not on file  . Number of children: 2  . Years of education: Not on file  . Highest education level: Not on file  Occupational History  . Occupation: disability  Social Needs  . Financial resource strain: Not very hard  . Food insecurity:    Worry: Never true    Inability: Never true  . Transportation needs:   Medical: No    Non-medical: No  Tobacco Use  . Smoking status: Current Every Day Smoker    Packs/day: 0.50    Years: 47.00    Pack years: 23.50    Types: Cigarettes  . Smokeless tobacco: Never Used  Substance and Sexual Activity  . Alcohol use: Not Currently    Comment: Recovered alcoholic  . Drug use: Yes    Types: Marijuana  . Sexual activity: Not Currently  Lifestyle  . Physical activity:    Days per week: 0 days    Minutes per session: 0 min  . Stress: To some extent  Relationships  . Social connections:    Talks on phone: Not on file    Gets together: Not on file    Attends religious service: Not on file    Active member of club or organization: Not on file    Attends meetings of clubs or organizations: Not on file    Relationship status: Not on file  . Intimate partner violence:    Fear of current or ex partner: No    Emotionally abused: No    Physically abused: No    Forced sexual activity: No  Other Topics Concern  . Not on file  Social History Narrative  . Not on file    Family History  Problem Relation Age of Onset  . Diabetes Mother   . Hypertension Mother   .  Stroke Mother   . Hypertension Sister   . Hypertension Brother     Review of Systems:  As stated in the HPI and otherwise negative.   BP 116/60   Pulse 72   Ht 5' 7.5" (1.715 m)   Wt 132 lb 9.6 oz (60.1 kg)   SpO2 97%   BMI 20.46 kg/m   Physical Examination: General: Well developed, well nourished, NAD  HEENT: OP clear, mucus membranes moist  SKIN: warm, dry. No rashes. Neuro: No focal deficits  Musculoskeletal: Muscle strength 5/5 all ext  Psychiatric: Mood and affect normal  Neck: No JVD, no carotid bruits, no thyromegaly, no lymphadenopathy.  Lungs:Clear bilaterally, no wheezes, rhonci, crackles Cardiovascular: Regular rate and rhythm. No murmurs, gallops or rubs. Abdomen:Soft. Bowel sounds present. Non-tender.  Extremities: No lower extremity edema. Pulses are 2 + in the  bilateral DP/PT.  EKG:  EKG is ordered today. The ekg ordered today demonstrates Sinus rhythm with sinus arrhythmia. T wave inversion inferior and anterolateral leads with ST depression anterolateral leads.   Recent Labs: 08/24/2018: ALT 13; BUN 14; Creatinine 1.0; Hemoglobin 13.5; Platelets 286; Potassium 4.5; Sodium 141; TSH 1.18   Lipid Panel    Component Value Date/Time   CHOL 222 (A) 08/24/2018 1124   TRIG 127 08/24/2018 1124   HDL 70 08/24/2018 1124   LDLCALC 127 08/24/2018 1124     Wt Readings from Last 3 Encounters:  11/23/18 132 lb 9.6 oz (60.1 kg)  10/19/18 133 lb 9.6 oz (60.6 kg)  09/27/18 134 lb (60.8 kg)     Other studies Reviewed: Additional studies/ records that were reviewed today include:  Review of the above records demonstrates:    Assessment and Plan:   1. Abnormal EKG: She had PACs on her EKG in primary care. Today her EKG shows sinus rhythm with sinus arrhythmia. She has T wave inversions in the inferior and anterolateral leads. She has no symptoms worrisome for angina. Will arrange echo to assess LVEF and exclude wall motion abnormalities.   2. HTN: BP is controlled. No changes  Current medicines are reviewed at length with the patient today.  The patient does not have concerns regarding medicines.  The following changes have been made:  no change  Labs/ tests ordered today include:   Orders Placed This Encounter  Procedures  . EKG 12-Lead  . ECHOCARDIOGRAM COMPLETE    Disposition:   FU with me as needed.    Signed, Lauree Chandler, MD 11/23/2018 9:33 AM    [address], [place], [place]  [address] Phone: [phone]; Fax: [phone]

## 2018-11-23 ENCOUNTER — Encounter: Payer: Self-pay | Admitting: Cardiovascular Disease

## 2018-11-23 ENCOUNTER — Encounter (INDEPENDENT_AMBULATORY_CARE_PROVIDER_SITE_OTHER): Payer: Self-pay

## 2018-11-23 ENCOUNTER — Ambulatory Visit (INDEPENDENT_AMBULATORY_CARE_PROVIDER_SITE_OTHER): Payer: Medicare HMO | Admitting: Cardiovascular Disease

## 2018-11-23 VITALS — BP 116/60 | HR 72 | Ht 67.5 in | Wt 132.6 lb

## 2018-11-23 DIAGNOSIS — R9431 Abnormal electrocardiogram [ECG] [EKG]: Secondary | ICD-10-CM

## 2018-11-23 DIAGNOSIS — I491 Atrial premature depolarization: Secondary | ICD-10-CM

## 2018-11-23 NOTE — Patient Instructions (Signed)
Medication Instructions:  Your physician recommends that you continue on your current medications as directed. Please refer to the Current Medication list given to you today.  If you need a refill on your cardiac medications before your next appointment, please call your pharmacy.   Lab work: none If you have labs (blood work) drawn today and your tests are completely normal, you will receive your results only by: Marland Kitchen MyChart Message (if you have MyChart) OR . A paper copy in the mail If you have any lab test that is abnormal or we need to change your treatment, we will call you to review the results.  Testing/Procedures: Your physician has requested that you have an echocardiogram. Echocardiography is a painless test that uses sound waves to create images of your heart. It provides your doctor with information about the size and shape of your heart and how well your heart's chambers and valves are working. This procedure takes approximately one hour. There are no restrictions for this procedure.    Follow-Up: Your physician recommends that you schedule a follow-up appointment as needed

## 2018-11-29 ENCOUNTER — Other Ambulatory Visit: Payer: Self-pay

## 2018-11-29 ENCOUNTER — Ambulatory Visit (HOSPITAL_COMMUNITY): Payer: Medicare HMO | Attending: Cardiology

## 2018-11-29 DIAGNOSIS — I491 Atrial premature depolarization: Secondary | ICD-10-CM | POA: Diagnosis not present

## 2018-11-29 DIAGNOSIS — R9431 Abnormal electrocardiogram [ECG] [EKG]: Secondary | ICD-10-CM | POA: Diagnosis not present

## 2018-11-30 ENCOUNTER — Telehealth: Payer: Self-pay

## 2018-11-30 NOTE — Telephone Encounter (Signed)
-----   Message from Burnell Blanks, MD sent at 11/30/2018  8:29 AM EST ----- Normal LV systolic function. No significant valve disease. Can we let her know? Thanks, chris

## 2018-11-30 NOTE — Telephone Encounter (Signed)
LMTCB

## 2018-12-01 NOTE — Telephone Encounter (Signed)
° ° °  Please call patient with echo results

## 2018-12-01 NOTE — Telephone Encounter (Signed)
Left message for patient to call back  

## 2018-12-01 NOTE — Telephone Encounter (Signed)
Pt aware of echo results ./cy 

## 2018-12-29 ENCOUNTER — Ambulatory Visit (INDEPENDENT_AMBULATORY_CARE_PROVIDER_SITE_OTHER): Payer: Medicare HMO | Admitting: Nurse Practitioner

## 2018-12-29 ENCOUNTER — Encounter: Payer: Self-pay | Admitting: Nurse Practitioner

## 2018-12-29 VITALS — BP 134/90 | HR 78 | Temp 98.2°F | Ht 69.0 in | Wt 131.6 lb

## 2018-12-29 DIAGNOSIS — R42 Dizziness and giddiness: Secondary | ICD-10-CM | POA: Diagnosis not present

## 2018-12-29 DIAGNOSIS — I10 Essential (primary) hypertension: Secondary | ICD-10-CM

## 2018-12-29 NOTE — Progress Notes (Signed)
Subjective:     Patient ID: Deanna Harrell , female    DOB: 01/24/1956 , 63 y.o.   MRN: [id number]   Chief Complaint  Patient presents with  . Hypertension    patient states she has been having some lightheadedness and dizziness in the mornings    HPI  She needs another FL2 form completed for [facility].  She has gone to another level with the substance abuse program  Hypertension  This is a chronic problem. The problem is controlled. Pertinent negatives include no anxiety, chest pain, headaches, neck pain or palpitations. There are no associated agents to hypertension.  Dizziness  This is a recurrent problem. The current episode started 1 to 4 weeks ago. The problem occurs intermittently. Progression since onset: has had 2 episodes of feeling like syncope. Pertinent negatives include no abdominal pain, chest pain, fatigue, headaches, neck pain, numbness, visual change, vomiting or weakness. Nothing aggravates the symptoms. The treatment provided no relief.     Past Medical History:  Diagnosis Date  . Hypertension   . Tobacco abuse      Family History  Problem Relation Age of Onset  . Diabetes Mother   . Hypertension Mother   . Stroke Mother   . Hypertension Sister   . Hypertension Brother      Current Outpatient Medications:  .  amLODipine (NORVASC) 2.5 MG tablet, Take 2.5 mg by mouth daily., Disp: , Rfl:  .  hydrochlorothiazide (HYDRODIURIL) 25 MG tablet, Take 1 tablet (25 mg total) by mouth daily., Disp: 30 tablet, Rfl: 0   No Known Allergies   Review of Systems  Constitutional: Negative for fatigue.  Respiratory: Negative.   Cardiovascular: Negative.  Negative for chest pain, palpitations and leg swelling.  Gastrointestinal: Negative for abdominal pain and vomiting.  Musculoskeletal: Negative for neck pain.  Neurological: Positive for dizziness. Negative for weakness, numbness and headaches.     Today's Vitals   12/29/18 1436  BP: 134/90  Pulse: 78   Temp: 98.2 F (36.8 C)  TempSrc: Oral  SpO2: 90%  Weight: 131 lb 9.6 oz (59.7 kg)  Height: '5\' 9"'  (1.753 m)  PainSc: 0-No pain   Body mass index is 19.43 kg/m.   Objective:  Physical Exam Vitals signs reviewed.  Constitutional:      Appearance: She is well-developed.  HENT:     Head: Normocephalic and atraumatic.  Cardiovascular:     Rate and Rhythm: Normal rate and regular rhythm.     Pulses: Normal pulses.     Heart sounds: Normal heart sounds. No murmur.  Pulmonary:     Effort: Pulmonary effort is normal.     Breath sounds: Normal breath sounds.  Musculoskeletal:        General: Tenderness: cervical and low back.  Skin:    General: Skin is warm and dry.     Capillary Refill: Capillary refill takes less than 2 seconds.  Neurological:     General: No focal deficit present.     Mental Status: She is alert and oriented to person, place, and time.     Cranial Nerves: No cranial nerve deficit.  Psychiatric:        Mood and Affect: Mood normal.         Assessment And Plan:    1. Essential hypertension . B/P is controlled.  . CMP ordered to check renal function.  . The importance of regular exercise and dietary modification was stressed to the patient.  . She has  also seen the cardiologist who did not find any abnormal findings. Her ECHO is normal.   - BMP8+eGFR  2. Lightheaded  HR increased approximately 20 beats upon standing  Encouraged to increase her water intake   Will check for metabolic causes before performing any type of imaging studies. - TSH - CBC no Diff       Minette Brine, FNP

## 2018-12-30 LAB — BMP8+EGFR
BUN/Creatinine Ratio: 15 (ref 12–28)
BUN: 16 mg/dL (ref 8–27)
CO2: 22 mmol/L (ref 20–29)
Calcium: 10.5 mg/dL — ABNORMAL HIGH (ref 8.7–10.3)
Chloride: 100 mmol/L (ref 96–106)
Creatinine, Ser: 1.05 mg/dL — ABNORMAL HIGH (ref 0.57–1.00)
GFR, EST AFRICAN AMERICAN: 65 mL/min/{1.73_m2} (ref >59)
GFR, EST NON AFRICAN AMERICAN: 57 mL/min/{1.73_m2} — AB (ref >59)
Glucose: 91 mg/dL (ref 65–99)
POTASSIUM: 4.7 mmol/L (ref 3.5–5.2)
SODIUM: 143 mmol/L (ref 134–144)

## 2018-12-30 LAB — CBC
HEMATOCRIT: 42.3 % (ref 34.0–46.6)
HEMOGLOBIN: 14.1 g/dL (ref 11.1–15.9)
MCH: 29.1 pg (ref 26.6–33.0)
MCHC: 33.3 g/dL (ref 31.5–35.7)
MCV: 87 fL (ref 79–97)
Platelets: 300 10*3/uL (ref 150–450)
RBC: 4.85 x10E6/uL (ref 3.77–5.28)
RDW: 13.1 % (ref 12.3–15.4)
WBC: 5.7 10*3/uL (ref 3.4–10.8)

## 2018-12-30 LAB — TSH: TSH: 1.77 u[IU]/mL (ref 0.450–4.500)

## 2019-01-03 ENCOUNTER — Encounter: Payer: Self-pay | Admitting: Nurse Practitioner

## 2019-01-23 ENCOUNTER — Other Ambulatory Visit: Payer: Self-pay | Admitting: Nurse Practitioner

## 2019-04-06 ENCOUNTER — Ambulatory Visit: Payer: Medicare HMO | Admitting: Nurse Practitioner

## 2019-04-10 ENCOUNTER — Ambulatory Visit (INDEPENDENT_AMBULATORY_CARE_PROVIDER_SITE_OTHER): Payer: Medicare HMO | Admitting: Nurse Practitioner

## 2019-04-10 ENCOUNTER — Encounter: Payer: Self-pay | Admitting: Nurse Practitioner

## 2019-04-10 ENCOUNTER — Other Ambulatory Visit: Payer: Self-pay

## 2019-04-10 VITALS — BP 118/76 | HR 61 | Temp 97.7°F | Ht 69.0 in | Wt 135.8 lb

## 2019-04-10 DIAGNOSIS — I10 Essential (primary) hypertension: Secondary | ICD-10-CM

## 2019-04-10 NOTE — Progress Notes (Signed)
Subjective:     Patient ID: Deanna Harrell , female    DOB: February 16, 1956 , 63 y.o.   MRN: [id number]   Chief Complaint  Patient presents with  . Hypertension    HPI  Hypertension  This is a chronic problem. The current episode started more than 1 year ago. The problem is controlled. Pertinent negatives include no anxiety, blurred vision, chest pain, headaches, malaise/fatigue, neck pain or palpitations. There are no associated agents to hypertension. There are no known risk factors for coronary artery disease. Past treatments include nothing. The current treatment provides no improvement. There are no compliance problems.  There is no history of angina or kidney disease. There is no history of chronic renal disease.     Past Medical History:  Diagnosis Date  . Hypertension   . Tobacco abuse      Family History  Problem Relation Age of Onset  . Diabetes Mother   . Hypertension Mother   . Stroke Mother   . Hypertension Sister   . Hypertension Brother      Current Outpatient Medications:  .  amLODipine (NORVASC) 2.5 MG tablet, TAKE 1 TABLET BY MOUTH EVERY DAY, Disp: 90 tablet, Rfl: 0 .  hydrochlorothiazide (HYDRODIURIL) 25 MG tablet, TAKE 1 TABLET BY MOUTH EVERY DAY, Disp: 90 tablet, Rfl: 0   No Known Allergies   Review of Systems  Constitutional: Negative for fatigue and malaise/fatigue.  Eyes: Negative for blurred vision.  Respiratory: Negative.   Cardiovascular: Negative.  Negative for chest pain, palpitations and leg swelling.  Gastrointestinal: Negative for abdominal pain and vomiting.  Musculoskeletal: Negative for neck pain.  Neurological: Negative for dizziness, weakness, numbness and headaches.     Today's Vitals   04/10/19 0905  BP: 118/76  Pulse: 61  Temp: 97.7 F (36.5 C)  TempSrc: Oral  SpO2: 95%  Weight: 135 lb 12.8 oz (61.6 kg)  Height: '5\' 9"'  (1.753 m)   Body mass index is 20.05 kg/m.   Objective:  Physical Exam Vitals signs reviewed.   Constitutional:      General: She is not in acute distress.    Appearance: Normal appearance. She is well-developed.  HENT:     Head: Normocephalic and atraumatic.  Cardiovascular:     Rate and Rhythm: Normal rate and regular rhythm.     Pulses: Normal pulses.     Heart sounds: Normal heart sounds. No murmur.  Pulmonary:     Effort: Pulmonary effort is normal.     Breath sounds: Normal breath sounds.  Musculoskeletal:        General: No tenderness.  Skin:    General: Skin is warm and dry.     Capillary Refill: Capillary refill takes less than 2 seconds.  Neurological:     General: No focal deficit present.     Mental Status: She is alert and oriented to person, place, and time.     Cranial Nerves: No cranial nerve deficit.  Psychiatric:        Mood and Affect: Mood normal.        Behavior: Behavior normal.        Thought Content: Thought content normal.        Judgment: Judgment normal.         Assessment And Plan:     1. Essential hypertension . B/P is well controlled.  Marland Kitchen BMP ordered to check renal function was slightly elevated at last visit. . The importance of regular exercise and dietary modification was stressed  to the patient.  - BMP8+eGFR   Minette Brine, FNP   COVID-19 Education: The signs and symptoms of COVID-19 were discussed with the patient and how to seek care for testing (follow up with PCP or arrange E-visit).  The importance of social distancing was discussed today. Also discussed the importance of using one pair of gloves for each task or to use sanitizer between task or washing hands.  Patient Risk:   After full review of this patients clinical status, I feel that they are at least moderate risk at this time.   THE PATIENT IS ENCOURAGED TO PRACTICE SOCIAL DISTANCING DUE TO THE COVID-19 PANDEMIC.

## 2019-04-11 LAB — BMP8+EGFR
BUN/Creatinine Ratio: 15 (ref 12–28)
BUN: 17 mg/dL (ref 8–27)
CO2: 23 mmol/L (ref 20–29)
Calcium: 10.3 mg/dL (ref 8.7–10.3)
Chloride: 101 mmol/L (ref 96–106)
Creatinine, Ser: 1.14 mg/dL — ABNORMAL HIGH (ref 0.57–1.00)
GFR calc Af Amer: 59 mL/min/{1.73_m2} — ABNORMAL LOW (ref >59)
GFR calc non Af Amer: 51 mL/min/{1.73_m2} — ABNORMAL LOW (ref >59)
Glucose: 90 mg/dL (ref 65–99)
Potassium: 4.3 mmol/L (ref 3.5–5.2)
Sodium: 142 mmol/L (ref 134–144)

## 2019-05-31 ENCOUNTER — Other Ambulatory Visit: Payer: Self-pay | Admitting: Nurse Practitioner

## 2019-07-12 ENCOUNTER — Ambulatory Visit: Payer: Medicare HMO | Admitting: Nurse Practitioner

## 2019-08-09 ENCOUNTER — Telehealth: Payer: Self-pay | Admitting: Nurse Practitioner

## 2019-08-09 NOTE — Telephone Encounter (Signed)
I left a message asking the pt to call me at [phone] to schedule AWV with Deanna Harrell before her physical [facility] calendar year). VDM (DD)

## 2019-08-15 ENCOUNTER — Telehealth: Payer: Self-pay | Admitting: Nurse Practitioner

## 2019-08-15 NOTE — Telephone Encounter (Signed)
I left a message asking the pt to call me at [phone] to schedule earlier AWV with [place]. VDM (DD)

## 2019-10-26 ENCOUNTER — Ambulatory Visit: Payer: Medicare HMO

## 2019-10-26 ENCOUNTER — Encounter: Payer: Medicare HMO | Admitting: Nurse Practitioner

## 2019-11-01 ENCOUNTER — Telehealth: Payer: Self-pay | Admitting: Nurse Practitioner

## 2019-11-01 NOTE — Telephone Encounter (Signed)
I left a message asking the patient to call and reschedule her AWV and CPE.

## 2019-11-21 ENCOUNTER — Encounter: Payer: Medicare Other | Admitting: Nurse Practitioner

## 2019-11-21 ENCOUNTER — Ambulatory Visit: Payer: Medicare Other

## 2019-11-21 ENCOUNTER — Telehealth: Payer: Self-pay

## 2019-11-21 NOTE — Telephone Encounter (Signed)
This nurse attempted to contact patient due to her missing today's scheduled AWV appointment. The voicemail box has not yet been set up so I was unable to leave a message.

## 2019-11-27 ENCOUNTER — Telehealth: Payer: Self-pay | Admitting: Nurse Practitioner

## 2019-11-27 NOTE — Telephone Encounter (Signed)
I called the patient to reschedule AWV & CPE, but there was no answer and no option to leave a message.

## 2020-01-11 ENCOUNTER — Ambulatory Visit: Payer: Medicare Other | Admitting: Nurse Practitioner

## 2020-01-11 ENCOUNTER — Ambulatory Visit: Payer: Medicare Other

## 2020-01-11 ENCOUNTER — Telehealth: Payer: Self-pay

## 2020-01-11 NOTE — Telephone Encounter (Signed)
This nurse attempted to contact patient in regards to missing today's scheduled AWV. Unable to leave a message. Number does not have a voicemail that is set up.

## 2020-01-22 ENCOUNTER — Telehealth: Payer: Self-pay | Admitting: Nurse Practitioner

## 2020-01-22 NOTE — Telephone Encounter (Signed)
I called the patient to reschedule AWV & OV, but there was no answer and no option to leave a message because the voicemail hasn't been set up yet. VDM (DD)

## 2020-02-08 ENCOUNTER — Other Ambulatory Visit: Payer: Self-pay

## 2020-02-08 ENCOUNTER — Encounter: Payer: Self-pay | Admitting: Nurse Practitioner

## 2020-02-08 ENCOUNTER — Ambulatory Visit (INDEPENDENT_AMBULATORY_CARE_PROVIDER_SITE_OTHER): Payer: Medicare Other | Admitting: Nurse Practitioner

## 2020-02-08 VITALS — BP 130/82 | HR 106 | Temp 98.2°F | Ht 69.0 in | Wt 135.0 lb

## 2020-02-08 DIAGNOSIS — Z23 Encounter for immunization: Secondary | ICD-10-CM

## 2020-02-08 DIAGNOSIS — Z1231 Encounter for screening mammogram for malignant neoplasm of breast: Secondary | ICD-10-CM

## 2020-02-08 DIAGNOSIS — I10 Essential (primary) hypertension: Secondary | ICD-10-CM | POA: Diagnosis not present

## 2020-02-08 DIAGNOSIS — Z1211 Encounter for screening for malignant neoplasm of colon: Secondary | ICD-10-CM

## 2020-02-08 DIAGNOSIS — F1220 Cannabis dependence, uncomplicated: Secondary | ICD-10-CM

## 2020-02-08 MED ORDER — AMLODIPINE BESYLATE 2.5 MG PO TABS: 2.5000 mg | ORAL_TABLET | Freq: Every day | ORAL | 1 refills | Status: DC

## 2020-02-08 MED ORDER — TETANUS-DIPHTH-ACELL PERTUSSIS 5-2.5-18.5 LF-MCG/0.5 IM SUSP: 0.5000 mL | Freq: Once | INTRAMUSCULAR | 0 refills | Status: AC

## 2020-02-08 NOTE — Patient Instructions (Signed)
COVID-19 Vaccine Information can be found at: [email or web address] For questions related to vaccine distribution or appointments, please email [email or web address] or call [phone].    

## 2020-02-08 NOTE — Progress Notes (Signed)
Subjective:     Patient ID: Deanna Harrell , female    DOB: 07/13/1956 , 64 y.o.   MRN: [id number]   Chief Complaint  Patient presents with  . Hypertension    HPI  Hypertension This is a chronic problem. The current episode started more than 1 year ago. The problem is unchanged. The problem is controlled. Pertinent negatives include no anxiety, blurred vision, chest pain, headaches, malaise/fatigue, neck pain or palpitations. There are no associated agents to hypertension. There are no known risk factors for coronary artery disease. Past treatments include nothing (stopped taking the amlodipine and hydrochlorthiazide). The current treatment provides no improvement. There are no compliance problems.  There is no history of angina or kidney disease. There is no history of chronic renal disease.     Past Medical History:  Diagnosis Date  . Hypertension   . Tobacco abuse      Family History  Problem Relation Age of Onset  . Diabetes Mother   . Hypertension Mother   . Stroke Mother   . Hypertension Sister   . Hypertension Brother      Current Outpatient Medications:  .  amLODipine (NORVASC) 2.5 MG tablet, TAKE 1 TABLET BY MOUTH EVERY DAY (Patient not taking: Reported on 02/08/2020), Disp: 90 tablet, Rfl: 0 .  hydrochlorothiazide (HYDRODIURIL) 25 MG tablet, TAKE 1 TABLET BY MOUTH EVERY DAY (Patient not taking: Reported on 02/08/2020), Disp: 90 tablet, Rfl: 0   No Known Allergies   Review of Systems  Constitutional: Negative for fatigue and malaise/fatigue.  Eyes: Negative for blurred vision.  Respiratory: Negative.   Cardiovascular: Negative.  Negative for chest pain, palpitations and leg swelling.  Gastrointestinal: Negative for abdominal pain and vomiting.  Musculoskeletal: Negative for neck pain.  Neurological: Negative for dizziness, weakness, numbness and headaches.  Psychiatric/Behavioral: Negative.      Today's Vitals   02/08/20 0930  BP: 130/82  Pulse: (!) 106   Temp: 98.2 F (36.8 C)  TempSrc: Oral  Weight: 135 lb (61.2 kg)  Height: 5\' 9"  (1.753 m)   Body mass index is 19.94 kg/m.   Objective:  Physical Exam Vitals reviewed.  Constitutional:      General: She is not in acute distress.    Appearance: Normal appearance. She is well-developed.  HENT:     Head: Normocephalic and atraumatic.  Cardiovascular:     Rate and Rhythm: Normal rate and regular rhythm.     Pulses: Normal pulses.     Heart sounds: Normal heart sounds. No murmur.  Pulmonary:     Effort: Pulmonary effort is normal.     Breath sounds: Normal breath sounds.  Musculoskeletal:        General: No tenderness.  Skin:    General: Skin is warm and dry.     Capillary Refill: Capillary refill takes less than 2 seconds.  Neurological:     General: No focal deficit present.     Mental Status: She is alert and oriented to person, place, and time.     Cranial Nerves: No cranial nerve deficit.  Psychiatric:        Mood and Affect: Mood normal.        Behavior: Behavior normal.        Thought Content: Thought content normal.        Judgment: Judgment normal.         Assessment And Plan:     1. Essential hypertension  Chronic, fair control  She is to start back  on her amlodipine - amLODipine (NORVASC) 2.5 MG tablet; Take 1 tablet (2.5 mg total) by mouth daily.  Dispense: 90 tablet; Refill: 1  2. Encounter for immunization  Will give tetanus vaccine today while in office. Refer to order management. TDAP will be administered to adults 61-38 years old every 10 years. - Tdap (BOOSTRIX) 5-2.5-18.5 LF-MCG/0.5 injection; Inject 0.5 mLs into the muscle once for 1 dose.  Dispense: 0.5 mL; Refill: 0  3. Encounter for screening mammogram for malignant neoplasm of breast  Pt instructed on Self Breast Exam.According to ACOG guidelines Women aged 89 and older are recommended to get an annual mammogram. Form completed and given to patient contact the [facility] for  appointment scheduing.   Pt encouraged to get annual mammogram - MM DIGITAL SCREENING BILATERAL; Future  4. Encounter for screening colonoscopy  According to USPTF Colorectal cancer Screening guidelines. Colonoscopy is recommended every 10 years, starting at age 27years.  Will refer to GI for colon cancer screening. - Ambulatory referral to Gastroenterology     Minette Brine, [place]   COVID-19 Education: The signs and symptoms of COVID-19 were discussed with the patient and how to seek care for testing (follow up with PCP or arrange E-visit).  The importance of social distancing was discussed today. Also discussed the importance of using one pair of gloves for each task or to use sanitizer between task or washing hands.  Patient Risk:   After full review of this patients clinical status, I feel that they are at least moderate risk at this time.   THE PATIENT IS ENCOURAGED TO PRACTICE SOCIAL DISTANCING DUE TO THE COVID-19 PANDEMIC.

## 2020-02-09 LAB — CMP14 + ANION GAP
ALT: 10 IU/L (ref 0–32)
AST: 14 IU/L (ref 0–40)
Albumin/Globulin Ratio: 1.6 (ref 1.2–2.2)
Albumin: 4.4 g/dL (ref 3.8–4.8)
Alkaline Phosphatase: 81 IU/L (ref 39–117)
Anion Gap: 17 mmol/L (ref 10.0–18.0)
BUN/Creatinine Ratio: 11 — ABNORMAL LOW (ref 12–28)
BUN: 10 mg/dL (ref 8–27)
Bilirubin Total: 0.3 mg/dL (ref 0.0–1.2)
CO2: 22 mmol/L (ref 20–29)
Calcium: 10 mg/dL (ref 8.7–10.3)
Chloride: 104 mmol/L (ref 96–106)
Creatinine, Ser: 0.93 mg/dL (ref 0.57–1.00)
GFR calc Af Amer: 75 mL/min/{1.73_m2} (ref >59)
GFR calc non Af Amer: 65 mL/min/{1.73_m2} (ref >59)
Globulin, Total: 2.7 g/dL (ref 1.5–4.5)
Glucose: 85 mg/dL (ref 65–99)
Potassium: 5 mmol/L (ref 3.5–5.2)
Sodium: 143 mmol/L (ref 134–144)
Total Protein: 7.1 g/dL (ref 6.0–8.5)

## 2020-02-22 ENCOUNTER — Ambulatory Visit: Payer: Medicare Other

## 2020-02-22 ENCOUNTER — Telehealth: Payer: Self-pay

## 2020-02-22 NOTE — Telephone Encounter (Signed)
This nurse attempted to contact patient due to no show for our scheduled AWV for today. Message was left for patient to call back in order to reschedule appointment.

## 2020-02-27 ENCOUNTER — Encounter: Payer: Self-pay | Admitting: Gastroenterology

## 2020-03-19 ENCOUNTER — Ambulatory Visit
Admission: RE | Admit: 2020-03-19 | Discharge: 2020-03-19 | Disposition: A | Discharge status: Home / Self Care | Payer: Medicaid Other | Source: Ambulatory Visit | Attending: Nurse Practitioner | Admitting: Nurse Practitioner

## 2020-03-19 ENCOUNTER — Other Ambulatory Visit: Payer: Self-pay

## 2020-03-19 DIAGNOSIS — Z1231 Encounter for screening mammogram for malignant neoplasm of breast: Secondary | ICD-10-CM | POA: Diagnosis not present

## 2020-03-20 ENCOUNTER — Other Ambulatory Visit: Payer: Self-pay | Admitting: Nurse Practitioner

## 2020-03-20 DIAGNOSIS — R928 Other abnormal and inconclusive findings on diagnostic imaging of breast: Secondary | ICD-10-CM

## 2020-04-01 ENCOUNTER — Ambulatory Visit: Payer: Medicare Other | Admitting: *Deleted

## 2020-04-01 ENCOUNTER — Other Ambulatory Visit: Payer: Self-pay

## 2020-04-01 VITALS — Temp 96.9°F | Ht 69.0 in | Wt 132.4 lb

## 2020-04-01 DIAGNOSIS — Z1211 Encounter for screening for malignant neoplasm of colon: Secondary | ICD-10-CM

## 2020-04-01 DIAGNOSIS — Z01818 Encounter for other preprocedural examination: Secondary | ICD-10-CM

## 2020-04-01 MED ORDER — NA SULFATE-K SULFATE-MG SULF 17.5-3.13-1.6 GM/177ML PO SOLN: 1.0000 | Freq: Once | ORAL | 0 refills | Status: AC

## 2020-04-01 NOTE — Progress Notes (Signed)

## 2020-04-08 ENCOUNTER — Ambulatory Visit
Admission: RE | Admit: 2020-04-08 | Discharge: 2020-04-08 | Disposition: A | Discharge status: Home / Self Care | Payer: Medicaid Other | Source: Ambulatory Visit | Attending: Nurse Practitioner | Admitting: Nurse Practitioner

## 2020-04-08 ENCOUNTER — Other Ambulatory Visit: Payer: Self-pay

## 2020-04-08 ENCOUNTER — Other Ambulatory Visit: Payer: Self-pay | Admitting: Nurse Practitioner

## 2020-04-08 DIAGNOSIS — R928 Other abnormal and inconclusive findings on diagnostic imaging of breast: Secondary | ICD-10-CM

## 2020-04-08 DIAGNOSIS — N6001 Solitary cyst of right breast: Secondary | ICD-10-CM | POA: Diagnosis not present

## 2020-04-10 ENCOUNTER — Ambulatory Visit (INDEPENDENT_AMBULATORY_CARE_PROVIDER_SITE_OTHER): Payer: Medicare Other

## 2020-04-10 ENCOUNTER — Other Ambulatory Visit: Payer: Self-pay | Admitting: Gastroenterology

## 2020-04-10 DIAGNOSIS — Z1159 Encounter for screening for other viral diseases: Secondary | ICD-10-CM | POA: Diagnosis not present

## 2020-04-11 LAB — SARS CORONAVIRUS 2 (TAT 6-24 HRS): SARS Coronavirus 2: NEGATIVE

## 2020-04-15 ENCOUNTER — Ambulatory Visit (AMBULATORY_SURGERY_CENTER): Payer: Medicare Other | Admitting: Gastroenterology

## 2020-04-15 ENCOUNTER — Other Ambulatory Visit: Payer: Self-pay

## 2020-04-15 ENCOUNTER — Encounter: Payer: Self-pay | Admitting: Gastroenterology

## 2020-04-15 VITALS — BP 154/97 | HR 66 | Temp 95.9°F | Resp 16 | Ht 69.0 in | Wt 132.4 lb

## 2020-04-15 DIAGNOSIS — Z1211 Encounter for screening for malignant neoplasm of colon: Secondary | ICD-10-CM

## 2020-04-15 DIAGNOSIS — D122 Benign neoplasm of ascending colon: Secondary | ICD-10-CM | POA: Diagnosis not present

## 2020-04-15 MED ORDER — SODIUM CHLORIDE 0.9 % IV SOLN: 500.0000 mL | Freq: Once | INTRAVENOUS | Status: DC

## 2020-04-15 NOTE — Progress Notes (Signed)
To pacu, VSS. Report to RN.tb 

## 2020-04-15 NOTE — Progress Notes (Signed)
Pt's states no medical or surgical changes since previsit or office visit.   TEMP-LC  V/S-DT 

## 2020-04-15 NOTE — Progress Notes (Signed)
Called to room to assist during endoscopic procedure.  Patient ID and intended procedure confirmed with present staff. Received instructions for my participation in the procedure from the performing physician.  

## 2020-04-15 NOTE — Patient Instructions (Signed)
Handouts Provided:  Polyps  YOU HAD AN ENDOSCOPIC PROCEDURE TODAY AT THE [facility]:   Refer to the procedure report that was given to you for any specific questions about what was found during the examination.  If the procedure report does not answer your questions, please call your gastroenterologist to clarify.  If you requested that your care partner not be given the details of your procedure findings, then the procedure report has been included in a sealed envelope for you to review at your convenience later.  YOU SHOULD EXPECT: Some feelings of bloating in the abdomen. Passage of more gas than usual.  Walking can help get rid of the air that was put into your GI tract during the procedure and reduce the bloating. If you had a lower endoscopy (such as a colonoscopy or flexible sigmoidoscopy) you may notice spotting of blood in your stool or on the toilet paper. If you underwent a bowel prep for your procedure, you may not have a normal bowel movement for a few days.  Please Note:  You might notice some irritation and congestion in your nose or some drainage.  This is from the oxygen used during your procedure.  There is no need for concern and it should clear up in a day or so.  SYMPTOMS TO REPORT IMMEDIATELY:   Following lower endoscopy (colonoscopy or flexible sigmoidoscopy):  Excessive amounts of blood in the stool  Significant tenderness or worsening of abdominal pains  Swelling of the abdomen that is new, acute  Fever of 100F or higher  For urgent or emergent issues, a gastroenterologist can be reached at any hour by calling [phone]. Do not use MyChart messaging for urgent concerns.    DIET:  We do recommend a small meal at first, but then you may proceed to your regular diet.  Drink plenty of fluids but you should avoid alcoholic beverages for 24 hours.  ACTIVITY:  You should plan to take it easy for the rest of today and you should NOT DRIVE or use heavy  machinery until tomorrow (because of the sedation medicines used during the test).    FOLLOW UP: Our staff will call the number listed on your records 48-72 hours following your procedure to check on you and address any questions or concerns that you may have regarding the information given to you following your procedure. If we do not reach you, we will leave a message.  We will attempt to reach you two times.  During this call, we will ask if you have developed any symptoms of COVID 19. If you develop any symptoms (ie: fever, flu-like symptoms, shortness of breath, cough etc.) before then, please call [phone].  If you test positive for Covid 19 in the 2 weeks post procedure, please call and report this information to us.    If any biopsies were taken you will be contacted by phone or by letter within the next 1-3 weeks.  Please call us at [phone] if you have not heard about the biopsies in 3 weeks.    SIGNATURES/CONFIDENTIALITY: You and/or your care partner have signed paperwork which will be entered into your electronic medical record.  These signatures attest to the fact that that the information above on your After Visit Summary has been reviewed and is understood.  Full responsibility of the confidentiality of this discharge information lies with you and/or your care-partner.  

## 2020-04-15 NOTE — Op Note (Signed)
[place] Patient Name: Deanna Harrell Procedure Date: 04/15/2020 11:29 AM MRN: [id number] Endoscopist: Thornton Park MD, MD Age: 64 Referring MD:  Date of Birth: July 17, 1956 Gender: Female Account #: [id number] Procedure:                Colonoscopy Indications:              Screening for colorectal malignant neoplasm, This                            is the patient's first colonoscopy                           No known family history of colon cancer or polyps Medicines:                Monitored Anesthesia Care Procedure:                Pre-Anesthesia Assessment:                           - Prior to the procedure, a History and Physical                            was performed, and patient medications and                            allergies were reviewed. The patient's tolerance of                            previous anesthesia was also reviewed. The risks                            and benefits of the procedure and the sedation                            options and risks were discussed with the patient.                            All questions were answered, and informed consent                            was obtained. Prior Anticoagulants: The patient has                            taken no previous anticoagulant or antiplatelet                            agents. ASA Grade Assessment: II - A patient with                            mild systemic disease. After reviewing the risks                            and benefits, the patient was deemed in  satisfactory condition to undergo the procedure.                           After obtaining informed consent, the colonoscope                            was passed under direct vision. Throughout the                            procedure, the patient's blood pressure, pulse, and                            oxygen saturations were monitored continuously. The                            Colonoscope was  introduced through the anus and                            advanced to the 3 cm into the ileum. A second                            forward view of the right colon was performed. The                            colonoscopy was performed without difficulty. The                            patient tolerated the procedure well. The quality                            of the bowel preparation was good. The terminal                            ileum, ileocecal valve, appendiceal orifice, and                            rectum were photographed. Scope In: 11:46:32 AM Scope Out: 11:57:48 AM Scope Withdrawal Time: 0 hours 8 minutes 40 seconds  Total Procedure Duration: 0 hours 11 minutes 16 seconds  Findings:                 The perianal and digital rectal examinations were                            normal.                           Non-bleeding internal hemorrhoids were found. The                            hemorrhoids were small.                           A few small-mouthed diverticula were found in the  sigmoid colon.                           A 2 mm polyp was found in the proximal ascending                            colon. The polyp was sessile. The polyp was removed                            with a cold snare. Resection and retrieval were                            complete. Estimated blood loss was minimal.                           The exam was otherwise without abnormality on                            direct and retroflexion views. Complications:            No immediate complications. Estimated blood loss:                            Minimal. Estimated Blood Loss:     Estimated blood loss was minimal. Impression:               - Non-bleeding internal hemorrhoids.                           - Diverticulosis in the sigmoid colon.                           - One 2 mm polyp in the proximal ascending colon,                            removed with a cold snare.  Resected and retrieved.                           - The examination was otherwise normal on direct                            and retroflexion views. Recommendation:           - Patient has a contact number available for                            emergencies. The signs and symptoms of potential                            delayed complications were discussed with the                            patient. Return to normal activities tomorrow.                            Written discharge instructions  were provided to the                            patient.                           - Continue present medications.                           - Await pathology results.                           - Repeat colonoscopy date to be determined after                            pending pathology results are reviewed for                            surveillance.                           - Follow a high fiber diet. Drink at least 64                            ounces of water daily. Add a daily stool bulking                            agent such as psyllium (an exampled would be                            Metamucil).                           - Emerging evidence supports eating a diet of                            fruits, vegetables, grains, calcium, and yogurt                            while reducing red meat and alcohol may reduce the                            risk of colon cancer.                           - Thank you for allowing me to be involved in your                            colon cancer prevention. Thornton Park MD, MD 04/15/2020 12:02:15 PM This report has been signed electronically.

## 2020-04-17 ENCOUNTER — Encounter: Payer: Self-pay | Admitting: Gastroenterology

## 2020-04-17 ENCOUNTER — Telehealth: Payer: Self-pay

## 2020-04-17 NOTE — Telephone Encounter (Signed)
  Follow up Call-  Call back number 04/15/2020  Post procedure Call Back phone  # [phone]  Permission to leave phone message Yes     Patient questions:  Do you have a fever, pain , or abdominal swelling? No. Pain Score  0 *  Have you tolerated food without any problems? Yes.    Have you been able to return to your normal activities? Yes.    Do you have any questions about your discharge instructions: Diet   No. Medications  No. Follow up visit  No.  Do you have questions or concerns about your Care? No.  Actions: * If pain score is 4 or above: 1. No action needed, pain <4.Have you developed a fever since your procedure? no  2.   Have you had an respiratory symptoms (SOB or cough) since your procedure? no  3.   Have you tested positive for COVID 19 since your procedure no  4.   Have you had any family members/close contacts diagnosed with the COVID 19 since your procedure?  no   If yes to any of these questions please route to Joylene John, RN and Erenest Rasher, RN

## 2020-06-13 ENCOUNTER — Encounter: Payer: Medicare Other | Admitting: Nurse Practitioner

## 2020-06-13 ENCOUNTER — Ambulatory Visit: Payer: Medicare Other | Admitting: Nurse Practitioner

## 2020-06-25 ENCOUNTER — Encounter: Payer: Self-pay | Admitting: Nurse Practitioner

## 2020-06-26 ENCOUNTER — Other Ambulatory Visit (HOSPITAL_COMMUNITY)
Admission: RE | Admit: 2020-06-26 | Discharge: 2020-06-26 | Disposition: A | Discharge status: Home / Self Care | Payer: Medicare Other | Source: Ambulatory Visit | Attending: Nurse Practitioner | Admitting: Nurse Practitioner

## 2020-06-26 ENCOUNTER — Ambulatory Visit (INDEPENDENT_AMBULATORY_CARE_PROVIDER_SITE_OTHER): Payer: Medicare Other | Admitting: Nurse Practitioner

## 2020-06-26 ENCOUNTER — Encounter: Payer: Self-pay | Admitting: Nurse Practitioner

## 2020-06-26 ENCOUNTER — Ambulatory Visit (INDEPENDENT_AMBULATORY_CARE_PROVIDER_SITE_OTHER): Payer: Medicare Other

## 2020-06-26 ENCOUNTER — Other Ambulatory Visit: Payer: Self-pay

## 2020-06-26 VITALS — BP 124/80 | HR 75 | Temp 98.0°F | Ht 69.0 in | Wt 128.2 lb

## 2020-06-26 VITALS — BP 124/80 | HR 75 | Temp 98.0°F | Ht 69.0 in | Wt 128.0 lb

## 2020-06-26 DIAGNOSIS — E7800 Pure hypercholesterolemia, unspecified: Secondary | ICD-10-CM

## 2020-06-26 DIAGNOSIS — Z0000 Encounter for general adult medical examination without abnormal findings: Secondary | ICD-10-CM | POA: Diagnosis not present

## 2020-06-26 DIAGNOSIS — R7309 Other abnormal glucose: Secondary | ICD-10-CM | POA: Diagnosis not present

## 2020-06-26 DIAGNOSIS — Z124 Encounter for screening for malignant neoplasm of cervix: Secondary | ICD-10-CM | POA: Diagnosis not present

## 2020-06-26 DIAGNOSIS — I10 Essential (primary) hypertension: Secondary | ICD-10-CM

## 2020-06-26 DIAGNOSIS — Z23 Encounter for immunization: Secondary | ICD-10-CM

## 2020-06-26 MED ORDER — TETANUS-DIPHTH-ACELL PERTUSSIS 5-2.5-18.5 LF-MCG/0.5 IM SUSP: 0.5000 mL | Freq: Once | INTRAMUSCULAR | 0 refills | Status: DC

## 2020-06-26 NOTE — Patient Instructions (Addendum)
Health Maintenance, Female Adopting a healthy lifestyle and getting preventive care are important in promoting health and wellness. Ask your health care provider about:  The right schedule for you to have regular tests and exams.  Things you can do on your own to prevent diseases and keep yourself healthy. What should I know about diet, weight, and exercise? Eat a healthy diet   Eat a diet that includes plenty of vegetables, fruits, low-fat dairy products, and lean protein.  Do not eat a lot of foods that are high in solid fats, added sugars, or sodium. Maintain a healthy weight Body mass index (BMI) is used to identify weight problems. It estimates body fat based on height and weight. Your health care provider can help determine your BMI and help you achieve or maintain a healthy weight. Get regular exercise Get regular exercise. This is one of the most important things you can do for your health. Most adults should:  Exercise for at least 150 minutes each week. The exercise should increase your heart rate and make you sweat (moderate-intensity exercise).  Do strengthening exercises at least twice a week. This is in addition to the moderate-intensity exercise.  Spend less time sitting. Even light physical activity can be beneficial. Watch cholesterol and blood lipids Have your blood tested for lipids and cholesterol at 65 years of age, then have this test every 5 years. Have your cholesterol levels checked more often if:  Your lipid or cholesterol levels are high.  You are older than 65 years of age.  You are at high risk for heart disease. What should I know about cancer screening? Depending on your health history and family history, you may need to have cancer screening at various ages. This may include screening for:  Breast cancer.  Cervical cancer.  Colorectal cancer.  Skin cancer.  Lung cancer. What should I know about heart disease, diabetes, and high blood  pressure? Blood pressure and heart disease  High blood pressure causes heart disease and increases the risk of stroke. This is more likely to develop in people who have high blood pressure readings, are of African descent, or are overweight.  Have your blood pressure checked: ? Every 3-5 years if you are 18-39 years of age. ? Every year if you are 40 years old or older. Diabetes Have regular diabetes screenings. This checks your fasting blood sugar level. Have the screening done:  Once every three years after age 40 if you are at a normal weight and have a low risk for diabetes.  More often and at a younger age if you are overweight or have a high risk for diabetes. What should I know about preventing infection? Hepatitis B If you have a higher risk for hepatitis B, you should be screened for this virus. Talk with your health care provider to find out if you are at risk for hepatitis B infection. Hepatitis C Testing is recommended for:  Everyone born from 1945 through 1965.  Anyone with known risk factors for hepatitis C. Sexually transmitted infections (STIs)  Get screened for STIs, including gonorrhea and chlamydia, if: ? You are sexually active and are younger than 65 years of age. ? You are older than 65 years of age and your health care provider tells you that you are at risk for this type of infection. ? Your sexual activity has changed since you were last screened, and you are at increased risk for chlamydia or gonorrhea. Ask your health care provider if   you are at risk.  Ask your health care provider about whether you are at high risk for HIV. Your health care provider may recommend a prescription medicine to help prevent HIV infection. If you choose to take medicine to prevent HIV, you should first get tested for HIV. You should then be tested every 3 months for as long as you are taking the medicine. Pregnancy  If you are about to stop having your period (premenopausal) and  you may become pregnant, seek counseling before you get pregnant.  Take 400 to 800 micrograms (mcg) of folic acid every day if you become pregnant.  Ask for birth control (contraception) if you want to prevent pregnancy. Osteoporosis and menopause Osteoporosis is a disease in which the bones lose minerals and strength with aging. This can result in bone fractures. If you are 76 years old or older, or if you are at risk for osteoporosis and fractures, ask your health care provider if you should:  Be screened for bone loss.  Take a calcium or vitamin D supplement to lower your risk of fractures.  Be given hormone replacement therapy (HRT) to treat symptoms of menopause. Follow these instructions at home: Lifestyle  Do not use any products that contain nicotine or tobacco, such as cigarettes, e-cigarettes, and chewing tobacco. If you need help quitting, ask your health care provider.  Do not use street drugs.  Do not share needles.  Ask your health care provider for help if you need support or information about quitting drugs. Alcohol use  Do not drink alcohol if: ? Your health care provider tells you not to drink. ? You are pregnant, may be pregnant, or are planning to become pregnant.  If you drink alcohol: ? Limit how much you use to 0-1 drink a day. ? Limit intake if you are breastfeeding.  Be aware of how much alcohol is in your drink. In the [place] one drink equals one 12 oz bottle of beer (355 mL), one 5 oz glass of wine (148 mL), or one 1 oz glass of hard liquor (44 mL). General instructions  Schedule regular health, dental, and eye exams.  Stay current with your vaccines.  Tell your health care provider if: ? You often feel depressed. ? You have ever been abused or do not feel safe at home. Summary  Adopting a healthy lifestyle and getting preventive care are important in promoting health and wellness.  Follow your health care provider's instructions about healthy  diet, exercising, and getting tested or screened for diseases.  Follow your health care provider's instructions on monitoring your cholesterol and blood pressure. This information is not intended to replace advice given to you by your health care provider. Make sure you discuss any questions you have with your health care provider. Document Revised: 12/07/2018 Document Reviewed: 12/07/2018 Elsevier Patient Education  [place].  COVID-19 Vaccine Information can be found at: ShippingScam.co.uk For questions related to vaccine distribution or appointments, please email [email or web address] or call [phone].

## 2020-06-26 NOTE — Progress Notes (Signed)
This visit occurred during the SARS-CoV-2 public health emergency.  Safety protocols were in place, including screening questions prior to the visit, additional usage of staff PPE, and extensive cleaning of exam room while observing appropriate contact time as indicated for disinfecting solutions.  Subjective:     Patient ID: Deanna Harrell , female    DOB: [birth year] , 64 y.o.   MRN: [id number]   Chief Complaint  Patient presents with   Annual Exam    HPI  HPI  The patient states she uses none for birth control. Postmenopausal.  Mammogram last done 03/19/2020.  Negative for: breast discharge, breast lump(s), breast pain and breast self exam.  Pertinent negatives include abnormal bleeding (hematology), anxiety, decreased libido, depression, difficulty falling sleep, dyspareunia, history of infertility, nocturia, sexual dysfunction, sleep disturbances, urinary incontinence, urinary urgency, vaginal discharge and vaginal itching. Diet regular, trying to eat more fresh fruit. She has cut back on fried foods. The patient states her exercise level is minimal, she will walk to the store twice a week or walk with her granddaughter around the block.   The patient's tobacco use is:  Social History   Tobacco Use  Smoking Status Current Every Day Smoker   Packs/day: 0.25   Years: 47.00   Pack years: 11.75   Types: Cigarettes  Smokeless Tobacco Never Used   She has been exposed to passive smoke. The patient's alcohol use is:  Social History   Substance and Sexual Activity  Alcohol Use Not Currently   Comment: Recovered alcoholic   Additional information: Last pap many years ago, next one scheduled for  Past Medical History:  Diagnosis Date   Hypertension    Substance abuse ([place])    Tobacco abuse      Family History  Problem Relation Age of Onset   Diabetes Mother    Hypertension Mother    Stroke Mother    Hypertension Sister    Hypertension Brother    Colon cancer Neg  Hx    Colon polyps Neg Hx    Stomach cancer Neg Hx    Rectal cancer Neg Hx    Esophageal cancer Neg Hx      Current Outpatient Medications:    amLODipine (NORVASC) 2.5 MG tablet, Take 1 tablet (2.5 mg total) by mouth daily., Disp: 90 tablet, Rfl: 1   No Known Allergies   Review of Systems   Today's Vitals   06/26/20 1131  BP: 124/80  Pulse: 75  Temp: 98 F (36.7 C)  TempSrc: Oral  Weight: 128 lb 3.2 oz (58.2 kg)  Height: 5\' 9"  (1.753 m)  PainSc: 0-No pain   Body mass index is 18.93 kg/m.   Objective:  Physical Exam Constitutional:      General: She is not in acute distress.    Appearance: Normal appearance. She is well-developed. She is obese.  HENT:     Head: Normocephalic and atraumatic.     Right Ear: Hearing, tympanic membrane, ear canal and external ear normal. There is no impacted cerumen.     Left Ear: Hearing, tympanic membrane, ear canal and external ear normal. There is no impacted cerumen.     Nose: Nose normal.     Mouth/Throat:     Mouth: Mucous membranes are dry.  Eyes:     General: Lids are normal.     Extraocular Movements: Extraocular movements intact.     Conjunctiva/sclera: Conjunctivae normal.     Pupils: Pupils are equal, round, and reactive to light.  Funduscopic exam:    Right eye: No papilledema.        Left eye: No papilledema.  Neck:     Thyroid: No thyroid mass.     Vascular: No carotid bruit.  Cardiovascular:     Rate and Rhythm: Normal rate and regular rhythm.     Pulses: Normal pulses.     Heart sounds: Normal heart sounds. No murmur heard.   Pulmonary:     Effort: Pulmonary effort is normal.     Breath sounds: Normal breath sounds.  Chest:     Chest wall: No mass.     Breasts: Tanner Score is 5.        Right: Normal. No mass or tenderness.        Left: Normal. No mass or tenderness.  Abdominal:     General: Abdomen is flat. Bowel sounds are normal. There is no distension.     Palpations: Abdomen is soft.      Tenderness: There is no abdominal tenderness.  Genitourinary:    Vagina: Normal.     Cervix: Normal.     Uterus: Normal.      Adnexa: Right adnexa normal and left adnexa normal.       Right: No mass or tenderness.         Left: No mass or tenderness.       Rectum: Normal. Guaiac result negative.  Musculoskeletal:        General: No swelling. Normal range of motion.     Cervical back: Full passive range of motion without pain, normal range of motion and neck supple.     Right lower leg: No edema.     Left lower leg: No edema.  Lymphadenopathy:     Upper Body:     Right upper body: No supraclavicular or axillary adenopathy.     Left upper body: No supraclavicular or axillary adenopathy.  Skin:    General: Skin is warm and dry.     Capillary Refill: Capillary refill takes less than 2 seconds.  Neurological:     General: No focal deficit present.     Mental Status: She is alert and oriented to person, place, and time.     Cranial Nerves: No cranial nerve deficit.     Sensory: No sensory deficit.  Psychiatric:        Mood and Affect: Mood normal.        Behavior: Behavior normal.        Thought Content: Thought content normal.        Judgment: Judgment normal.         Assessment And Plan:     1. Health maintenance examination  Behavior modifications discussed and diet history reviewed.    Pt will continue to exercise regularly and modify diet with low GI, plant based foods and decrease intake of processed foods.   Recommend intake of daily multivitamin, Vitamin D, and calcium.   Recommend mammogram for preventive screenings, as well as recommend immunizations that include influenza, TDAP  2. Encounter for Papanicolaou smear of cervix  No abnormal findings on physical exam - CBC no Diff - Lipid panel - Cytology -Pap Smear  3. Encounter for immunization  TDAP will be administered to adults 60-28 years old every 10 years.  Sent Rx to pharmacy  4. Essential  hypertension  Chronic, good control  EKG done with NSR - POCT Urinalysis Dipstick (81002) - POCT UA - Microalbumin - EKG 12-Lead - CMP14 + Anion  Gap  5. Abnormal glucose Chronic, stable No current medications Encouraged to limit intake of sugary foods and drinks Encouraged to increase physical activity to 150 minutes per week - Hemoglobin A1c  6. Elevated cholesterol  No current medications, diet controlled  Advised to avoid fried and fatty foods  Will check lipid panel  Minette Brine, FNP    THE PATIENT IS ENCOURAGED TO PRACTICE SOCIAL DISTANCING DUE TO THE COVID-19 PANDEMIC.

## 2020-06-26 NOTE — Progress Notes (Signed)
This visit occurred during the SARS-CoV-2 public health emergency.  Safety protocols were in place, including screening questions prior to the visit, additional usage of staff PPE, and extensive cleaning of exam room while observing appropriate contact time as indicated for disinfecting solutions.  Subjective:   Deanna Harrell is a 64 y.o. female who presents for an Initial Medicare Annual Wellness Visit.  Review of Systems     Cardiac Risk Factors include: hypertension;sedentary lifestyle;smoking/ tobacco exposure     Objective:    Today's Vitals   06/26/20 1213  BP: 124/80  Pulse: 75  Temp: 98 F (36.7 C)  TempSrc: Oral  Weight: 128 lb (58.1 kg)  Height: 5\' 9"  (1.753 m)   Body mass index is 18.9 kg/m.  Advanced Directives 06/26/2020 10/19/2018 01/24/2018  Does Patient Have a Medical Advance Directive? No No No  Would patient like information on creating a medical advance directive? No - Patient declined No - Patient declined No - Patient declined    Current Medications (verified) Outpatient Encounter Medications as of 06/26/2020  Medication Sig  . amLODipine (NORVASC) 2.5 MG tablet Take 1 tablet (2.5 mg total) by mouth daily.   No facility-administered encounter medications on file as of 06/26/2020.    Allergies (verified) Patient has no known allergies.   History: Past Medical History:  Diagnosis Date  . Hypertension   . Substance abuse ([place])   . Tobacco abuse    History reviewed. No pertinent surgical history. Family History  Problem Relation Age of Onset  . Diabetes Mother   . Hypertension Mother   . Stroke Mother   . Hypertension Sister   . Hypertension Brother   . Colon cancer Neg Hx   . Colon polyps Neg Hx   . Stomach cancer Neg Hx   . Rectal cancer Neg Hx   . Esophageal cancer Neg Hx    Social History   Socioeconomic History  . Marital status: Single    Spouse name: Not on file  . Number of children: 2  . Years of education: Not on file  .  Highest education level: Not on file  Occupational History  . Occupation: disability  Tobacco Use  . Smoking status: Current Every Day Smoker    Packs/day: 0.25    Years: 47.00    Pack years: 11.75    Types: Cigarettes  . Smokeless tobacco: Never Used  . Tobacco comment: she smokes 1-2 cigarettes   Vaping Use  . Vaping Use: Former  Substance and Sexual Activity  . Alcohol use: Not Currently    Comment: Recovered alcoholic  . Drug use: Yes    Frequency: 2.0 times per week    Types: Marijuana    Comment: LAST SMOKRED 04/14/20  . Sexual activity: Not Currently  Other Topics Concern  . Not on file  Social History Narrative  . Not on file   Social Determinants of Health   Financial Resource Strain: Low Risk   . Difficulty of Paying Living Expenses: Not hard at all  Food Insecurity: Food Insecurity Present  . Worried About Charity fundraiser in the Last Year: Sometimes true  . Ran Out of Food in the Last Year: Sometimes true  Transportation Needs: No Transportation Needs  . Lack of Transportation (Medical): No  . Lack of Transportation (Non-Medical): No  Physical Activity: Insufficiently Active  . Days of Exercise per Week: 3 days  . Minutes of Exercise per Session: 30 min  Stress: No Stress Concern Present  .  Feeling of Stress : Not at all  Social Connections:   . Frequency of Communication with Friends and Family:   . Frequency of Social Gatherings with Friends and Family:   . Attends Religious Services:   . Active Member of Clubs or Organizations:   . Attends Archivist Meetings:   Marland Kitchen Marital Status:     Tobacco Counseling Ready to quit: Not Answered Counseling given: Not Answered Comment: she smokes 1-2 cigarettes    Clinical Intake:  Pre-visit preparation completed: Yes  Pain : No/denies pain     Nutritional Status: BMI of 19-24  Normal Nutritional Risks: None Diabetes: No  How often do you need to have someone help you when you read  instructions, pamphlets, or other written materials from your doctor or pharmacy?: 1 - Never What is the last grade level you completed in school?: 2 yrs college  Diabetic? no  Interpreter Needed?: No  Information entered by :: NAllen LPN   Activities of Daily Living In your present state of health, do you have any difficulty performing the following activities: 06/26/2020 02/08/2020  Hearing? Y N  Comment right ear giving most trouble -  Vision? N N  Difficulty concentrating or making decisions? Y Y  Comment some Have not been taking medicatins don't remember to take them  Walking or climbing stairs? N N  Dressing or bathing? N N  Doing errands, shopping? N N  Preparing Food and eating ? N -  Using the Toilet? N -  In the past six months, have you accidently leaked urine? N -  Do you have problems with loss of bowel control? N -  Managing your Medications? N -  Managing your Finances? N -  Housekeeping or managing your Housekeeping? N -  Some recent data might be hidden    Patient Care Team: Minette Brine, FNP as PCP - General (General Practice)  Indicate any recent Medical Services you may have received from other than [facility] providers in the past year (date may be approximate).     Assessment:   This is a routine wellness examination for Deanna Harrell.  Hearing/Vision screen  Hearing Screening   125Hz  250Hz  500Hz  1000Hz  2000Hz  3000Hz  4000Hz  6000Hz  8000Hz   Right ear:           Left ear:           Vision Screening Comments: No regular eye exams  Dietary issues and exercise activities discussed: Current Exercise Habits: Home exercise routine, Type of exercise: walking, Time (Minutes): 30, Frequency (Times/Week): 3, Weekly Exercise (Minutes/Week): 90  Goals    . Patient Stated     06/26/2020, no goals      Depression Screen PHQ 2/9 Scores 06/26/2020 02/08/2020 12/29/2018 10/19/2018  PHQ - 2 Score 0 1 0 0  PHQ- 9 Score - - - 9    Fall Risk Fall Risk  06/26/2020 02/08/2020  12/29/2018 10/19/2018  Falls in the past year? 0 0 0 No  Risk for fall due to : Medication side effect - - Medication side effect  Follow up Falls evaluation completed;Education provided;Falls prevention discussed - - -    Any stairs in or around the home? Yes  If so, are there any without handrails? Yes  Home free of loose throw rugs in walkways, pet beds, electrical cords, etc? Yes  Adequate lighting in your home to reduce risk of falls? Yes   ASSISTIVE DEVICES UTILIZED TO PREVENT FALLS:  Life alert? No  Use of a cane,  walker or w/c? No  Grab bars in the bathroom? No  Shower chair or bench in shower? No  Elevated toilet seat or a handicapped toilet? No   TIMED UP AND GO:  Was the test performed? No .   Gait steady and fast without use of assistive device  Cognitive Function:     6CIT Screen 06/26/2020 10/19/2018  What Year? 0 points 0 points  What month? 0 points 0 points  What time? 3 points 0 points  Count back from 20 0 points 0 points  Months in reverse 4 points 0 points  Repeat phrase 2 points 0 points  Total Score 9 0    Immunizations  There is no immunization history on file for this patient.  TDAP status: Due, Education has been provided regarding the importance of this vaccine. Advised may receive this vaccine at local pharmacy or Health Dept. Aware to provide a copy of the vaccination record if obtained from local pharmacy or Health Dept. Verbalized acceptance and understanding. Flu Vaccine status: Declined, Education has been provided regarding the importance of this vaccine but patient still declined. Advised may receive this vaccine at local pharmacy or Health Dept. Aware to provide a copy of the vaccination record if obtained from local pharmacy or Health Dept. Verbalized acceptance and understanding. Pneumococcal vaccine status: Declined,  Education has been provided regarding the importance of this vaccine but patient still declined. Advised may receive this  vaccine at local pharmacy or Health Dept. Aware to provide a copy of the vaccination record if obtained from local pharmacy or Health Dept. Verbalized acceptance and understanding.  Covid-19 vaccine status: Information provided on how to obtain vaccines.   Qualifies for Shingles Vaccine? Yes   Zostavax completed No   Shingrix Completed?: No.    Education has been provided regarding the importance of this vaccine. Patient has been advised to call insurance company to determine out of pocket expense if they have not yet received this vaccine. Advised may also receive vaccine at local pharmacy or Health Dept. Verbalized acceptance and understanding.  Screening Tests Health Maintenance  Topic Date Due  . PAP SMEAR-Modifier  Never done  . COVID-19 Vaccine (1) 07/12/2020 (Originally 08/05/1968)  . TETANUS/TDAP  06/26/2021 (Originally 08/04/1974)  . HIV Screening  06/26/2021 (Originally 08/04/1970)  . INFLUENZA VACCINE  07/28/2020  . MAMMOGRAM  03/19/2022  . COLONOSCOPY  04/16/2027  . Hepatitis C Screening  Completed    Health Maintenance  Health Maintenance Due  Topic Date Due  . PAP SMEAR-Modifier  Never done    Colorectal cancer screening: Completed 04/15/2020 . Repeat every 10 years Mammogram status: Completed 03/19/2020. Repeat every year Bone Density status: n/a  Lung Cancer Screening: (Low Dose CT Chest recommended if Age 39-80 years, 30 pack-year currently smoking OR have quit w/in 15years.) does not qualify.   Lung Cancer Screening Referral: no  Additional Screening:  Hepatitis C Screening: does qualify; Completed 08/24/2018  Vision Screening: Recommended annual ophthalmology exams for early detection of glaucoma and other disorders of the eye. Is the patient up to date with their annual eye exam?  No  Who is the provider or what is the name of the office in which the patient attends annual eye exams? Does not have one If pt is not established with a provider, would they like to be  referred to a provider to establish care? Yes .   Dental Screening: Recommended annual dental exams for proper oral hygiene  Community Resource Referral / Chronic  Care Management: CRR required this visit?  Yes   CCM required this visit?  No      Plan:     I have personally reviewed and noted the following in the patient's chart:   . Medical and social history . Use of alcohol, tobacco or illicit drugs  . Current medications and supplements . Functional ability and status . Nutritional status . Physical activity . Advanced directives . List of other physicians . Hospitalizations, surgeries, and ER visits in previous 12 months . Vitals . Screenings to include cognitive, depression, and falls . Referrals and appointments  In addition, I have reviewed and discussed with patient certain preventive protocols, quality metrics, and best practice recommendations. A written personalized care plan for preventive services as well as general preventive health recommendations were provided to patient.     Kellie Simmering, LPN   [id number]/[id number]   Nurse Notes: Referral put in to care guides for information about food banks

## 2020-06-26 NOTE — Patient Instructions (Addendum)
Deanna Harrell , Thank you for taking time to come for your Medicare Wellness Visit. I appreciate your ongoing commitment to your health goals. Please review the following plan we discussed and let me know if I can assist you in the future.   Screening recommendations/referrals: Colonoscopy: completed 04/15/2020, due 04/15/2030 Mammogram: completed 03/19/2020, due 03/19/2021 Bone Density: n/a Recommended yearly ophthalmology/optometry visit for glaucoma screening and checkup Recommended yearly dental visit for hygiene and checkup  Vaccinations: Influenza vaccine: decline Pneumococcal vaccine: decline Tdap vaccine: decline Shingles vaccine: decline  Covid-19: decline  Advanced directives: Advance directive discussed with you today. Even though you declined this today please call our office should you change your mind and we can give you the proper paperwork for you to fill out.   Conditions/risks identified: smoking  Next appointment:12/16/2020 at 10:30  Follow up in one year for your annual wellness visit.   Preventive Care 40-64 Years, Female Preventive care refers to lifestyle choices and visits with your health care provider that can promote health and wellness. What does preventive care include?  A yearly physical exam. This is also called an annual well check.  Dental exams once or twice a year.  Routine eye exams. Ask your health care provider how often you should have your eyes checked.  Personal lifestyle choices, including:  Daily care of your teeth and gums.  Regular physical activity.  Eating a healthy diet.  Avoiding tobacco and drug use.  Limiting alcohol use.  Practicing safe sex.  Taking low-dose aspirin daily starting at age 35.  Taking vitamin and mineral supplements as recommended by your health care provider. What happens during an annual well check? The services and screenings done by your health care provider during your annual well check will depend  on your age, overall health, lifestyle risk factors, and family history of disease. Counseling  Your health care provider may ask you questions about your:  Alcohol use.  Tobacco use.  Drug use.  Emotional well-being.  Home and relationship well-being.  Sexual activity.  Eating habits.  Work and work Statistician.  Method of birth control.  Menstrual cycle.  Pregnancy history. Screening  You may have the following tests or measurements:  Height, weight, and BMI.  Blood pressure.  Lipid and cholesterol levels. These may be checked every 5 years, or more frequently if you are over 96 years old.  Skin check.  Lung cancer screening. You may have this screening every year starting at age 107 if you have a 30-pack-year history of smoking and currently smoke or have quit within the past 15 years.  Fecal occult blood test (FOBT) of the stool. You may have this test every year starting at age 44.  Flexible sigmoidoscopy or colonoscopy. You may have a sigmoidoscopy every 5 years or a colonoscopy every 10 years starting at age 63.  Hepatitis C blood test.  Hepatitis B blood test.  Sexually transmitted disease (STD) testing.  Diabetes screening. This is done by checking your blood sugar (glucose) after you have not eaten for a while (fasting). You may have this done every 1-3 years.  Mammogram. This may be done every 1-2 years. Talk to your health care provider about when you should start having regular mammograms. This may depend on whether you have a family history of breast cancer.  BRCA-related cancer screening. This may be done if you have a family history of breast, ovarian, tubal, or peritoneal cancers.  Pelvic exam and Pap test. This may be done  every 3 years starting at age 53. Starting at age 28, this may be done every 5 years if you have a Pap test in combination with an HPV test.  Bone density scan. This is done to screen for osteoporosis. You may have this scan  if you are at high risk for osteoporosis. Discuss your test results, treatment options, and if necessary, the need for more tests with your health care provider. Vaccines  Your health care provider may recommend certain vaccines, such as:  Influenza vaccine. This is recommended every year.  Tetanus, diphtheria, and acellular pertussis (Tdap, Td) vaccine. You may need a Td booster every 10 years.  Zoster vaccine. You may need this after age 48.  Pneumococcal 13-valent conjugate (PCV13) vaccine. You may need this if you have certain conditions and were not previously vaccinated.  Pneumococcal polysaccharide (PPSV23) vaccine. You may need one or two doses if you smoke cigarettes or if you have certain conditions. Talk to your health care provider about which screenings and vaccines you need and how often you need them. This information is not intended to replace advice given to you by your health care provider. Make sure you discuss any questions you have with your health care provider. Document Released: 01/10/2016 Document Revised: 09/02/2016 Document Reviewed: 10/15/2015 Elsevier Interactive Patient Education  2017 [place] Prevention in the Home Falls can cause injuries. They can happen to people of all ages. There are many things you can do to make your home safe and to help prevent falls. What can I do on the outside of my home?  Regularly fix the edges of walkways and driveways and fix any cracks.  Remove anything that might make you trip as you walk through a door, such as a raised step or threshold.  Trim any bushes or trees on the path to your home.  Use bright outdoor lighting.  Clear any walking paths of anything that might make someone trip, such as rocks or tools.  Regularly check to see if handrails are loose or broken. Make sure that both sides of any steps have handrails.  Any raised decks and porches should have guardrails on the edges.  Have any  leaves, snow, or ice cleared regularly.  Use sand or salt on walking paths during winter.  Clean up any spills in your garage right away. This includes oil or grease spills. What can I do in the bathroom?  Use night lights.  Install grab bars by the toilet and in the tub and shower. Do not use towel bars as grab bars.  Use non-skid mats or decals in the tub or shower.  If you need to sit down in the shower, use a plastic, non-slip stool.  Keep the floor dry. Clean up any water that spills on the floor as soon as it happens.  Remove soap buildup in the tub or shower regularly.  Attach bath mats securely with double-sided non-slip rug tape.  Do not have throw rugs and other things on the floor that can make you trip. What can I do in the bedroom?  Use night lights.  Make sure that you have a light by your bed that is easy to reach.  Do not use any sheets or blankets that are too big for your bed. They should not hang down onto the floor.  Have a firm chair that has side arms. You can use this for support while you get dressed.  Do not have  throw rugs and other things on the floor that can make you trip. What can I do in the kitchen?  Clean up any spills right away.  Avoid walking on wet floors.  Keep items that you use a lot in easy-to-reach places.  If you need to reach something above you, use a strong step stool that has a grab bar.  Keep electrical cords out of the way.  Do not use floor polish or wax that makes floors slippery. If you must use wax, use non-skid floor wax.  Do not have throw rugs and other things on the floor that can make you trip. What can I do with my stairs?  Do not leave any items on the stairs.  Make sure that there are handrails on both sides of the stairs and use them. Fix handrails that are broken or loose. Make sure that handrails are as long as the stairways.  Check any carpeting to make sure that it is firmly attached to the stairs.  Fix any carpet that is loose or worn.  Avoid having throw rugs at the top or bottom of the stairs. If you do have throw rugs, attach them to the floor with carpet tape.  Make sure that you have a light switch at the top of the stairs and the bottom of the stairs. If you do not have them, ask someone to add them for you. What else can I do to help prevent falls?  Wear shoes that:  Do not have high heels.  Have rubber bottoms.  Are comfortable and fit you well.  Are closed at the toe. Do not wear sandals.  If you use a stepladder:  Make sure that it is fully opened. Do not climb a closed stepladder.  Make sure that both sides of the stepladder are locked into place.  Ask someone to hold it for you, if possible.  Clearly mark and make sure that you can see:  Any grab bars or handrails.  First and last steps.  Where the edge of each step is.  Use tools that help you move around (mobility aids) if they are needed. These include:  Canes.  Walkers.  Scooters.  Crutches.  Turn on the lights when you go into a dark area. Replace any light bulbs as soon as they burn out.  Set up your furniture so you have a clear path. Avoid moving your furniture around.  If any of your floors are uneven, fix them.  If there are any pets around you, be aware of where they are.  Review your medicines with your doctor. Some medicines can make you feel dizzy. This can increase your chance of falling. Ask your doctor what other things that you can do to help prevent falls. This information is not intended to replace advice given to you by your health care provider. Make sure you discuss any questions you have with your health care provider. Document Released: 10/10/2009 Document Revised: 05/21/2016 Document Reviewed: 01/18/2015 Elsevier Interactive Patient Education  2017 [employer].

## 2020-06-28 LAB — CYTOLOGY - PAP: Diagnosis: NEGATIVE

## 2020-07-02 NOTE — Progress Notes (Signed)
If she is not planning to get the covid vaccine within the next month she can get the tetanus we are not doing them together.

## 2020-07-14 MED ORDER — TETANUS-DIPHTH-ACELL PERTUSSIS 5-2.5-18.5 LF-MCG/0.5 IM SUSP: 0.5000 mL | Freq: Once | INTRAMUSCULAR | 0 refills | Status: AC

## 2020-08-20 ENCOUNTER — Telehealth: Payer: Self-pay | Admitting: Nurse Practitioner

## 2020-08-20 NOTE — Telephone Encounter (Signed)
   [facility] 08/20/2020   Name: Deanna Harrell   MRN: [id number]   DOB: 1956/07/02   AGE: 64 y.o.   GENDER: female   PCP Minette Brine, FNP.   Called pt regarding [employer] Referral for food banks. Patient asked if resources list of food banks can be sent to her in the mail. Care Guide informed patient that list will be sent in the mail and that she will receive a follow up call on Friday 08/23/20 to see if she has received the letter. Patient stated understanding.   Follow up on: 08/23/20  [place], Care Management Phone: [phone] Email: [email or web address]

## 2020-08-21 ENCOUNTER — Encounter: Payer: Self-pay | Admitting: Nurse Practitioner

## 2020-08-26 NOTE — Telephone Encounter (Signed)
Spoke wit Deanna Harrell regarding referral. Patient stated that she has received the resource list for food banks in the area. No additional needs at this time.   Closing referral pending any other needs of patient.

## 2020-10-09 ENCOUNTER — Ambulatory Visit
Admission: RE | Admit: 2020-10-09 | Discharge: 2020-10-09 | Disposition: A | Discharge status: Home / Self Care | Payer: Medicare Other | Source: Ambulatory Visit | Attending: Nurse Practitioner | Admitting: Nurse Practitioner

## 2020-10-09 ENCOUNTER — Other Ambulatory Visit: Payer: Self-pay

## 2020-10-09 DIAGNOSIS — R928 Other abnormal and inconclusive findings on diagnostic imaging of breast: Secondary | ICD-10-CM

## 2020-10-09 DIAGNOSIS — N6001 Solitary cyst of right breast: Secondary | ICD-10-CM | POA: Diagnosis not present

## 2020-12-16 ENCOUNTER — Encounter: Payer: Medicare Other | Admitting: Nurse Practitioner

## 2020-12-16 NOTE — Progress Notes (Signed)
  I,Guadalupe Nickless [employer] as a Education administrator for [employer], FNP.,have documented all relevant documentation on the behalf of Minette Brine, FNP,as directed by  Minette Brine, FNP while in the presence of Minette Brine, [place]. This visit occurred during the SARS-CoV-2 public health emergency.  Safety protocols were in place, including screening questions prior to the visit, additional usage of staff PPE, and extensive cleaning of exam room while observing appropriate contact time as indicated for disinfecting solutions.  Subjective:     Patient ID: Deanna Harrell , female    DOB: Apr 07, 1956 , 64 y.o.   MRN: [id number]   Chief Complaint  Patient presents with  . Hypertension    HPI  Patient here for a blood pressure f/u  Hypertension     Past Medical History:  Diagnosis Date  . Hypertension   . Substance abuse ([place])   . Tobacco abuse      Family History  Problem Relation Age of Onset  . Diabetes Mother   . Hypertension Mother   . Stroke Mother   . Hypertension Sister   . Hypertension Brother   . Colon cancer Neg Hx   . Colon polyps Neg Hx   . Stomach cancer Neg Hx   . Rectal cancer Neg Hx   . Esophageal cancer Neg Hx      Current Outpatient Medications:  .  amLODipine (NORVASC) 2.5 MG tablet, Take 1 tablet (2.5 mg total) by mouth daily., Disp: 90 tablet, Rfl: 1   No Known Allergies   Review of Systems  Constitutional: Negative.   HENT: Negative.   Eyes: Negative.   Respiratory: Negative.   Cardiovascular: Negative.   Gastrointestinal: Negative.   Endocrine: Negative.   Genitourinary: Negative.   Musculoskeletal: Negative.   Skin: Negative.   Neurological: Negative.   Hematological: Negative.   Psychiatric/Behavioral: Negative.      There were no vitals filed for this visit. There is no height or weight on file to calculate BMI.   Objective:  Physical Exam      Assessment And Plan:     1. Essential hypertension     Patient was given opportunity to  ask questions. Patient verbalized understanding of the plan and was able to repeat key elements of the plan. All questions were answered to their satisfaction.  [address] [place], CMA   I, [place], [place], have reviewed all documentation for this visit. The documentation on 12/16/20 for the exam, diagnosis, procedures, and orders are all accurate and complete.  THE PATIENT IS ENCOURAGED TO PRACTICE SOCIAL DISTANCING DUE TO THE COVID-19 PANDEMIC.

## 2021-03-20 IMAGING — MG DIGITAL SCREENING BILAT W/ CAD
5 series · 5 of 5 positions shown · non-contrast
Comparison: Baseline exam.

CLINICAL DATA: Screening.

EXAM:
DIGITAL SCREENING BILATERAL MAMMOGRAM WITH CAD

[R CC (1 of 2)]
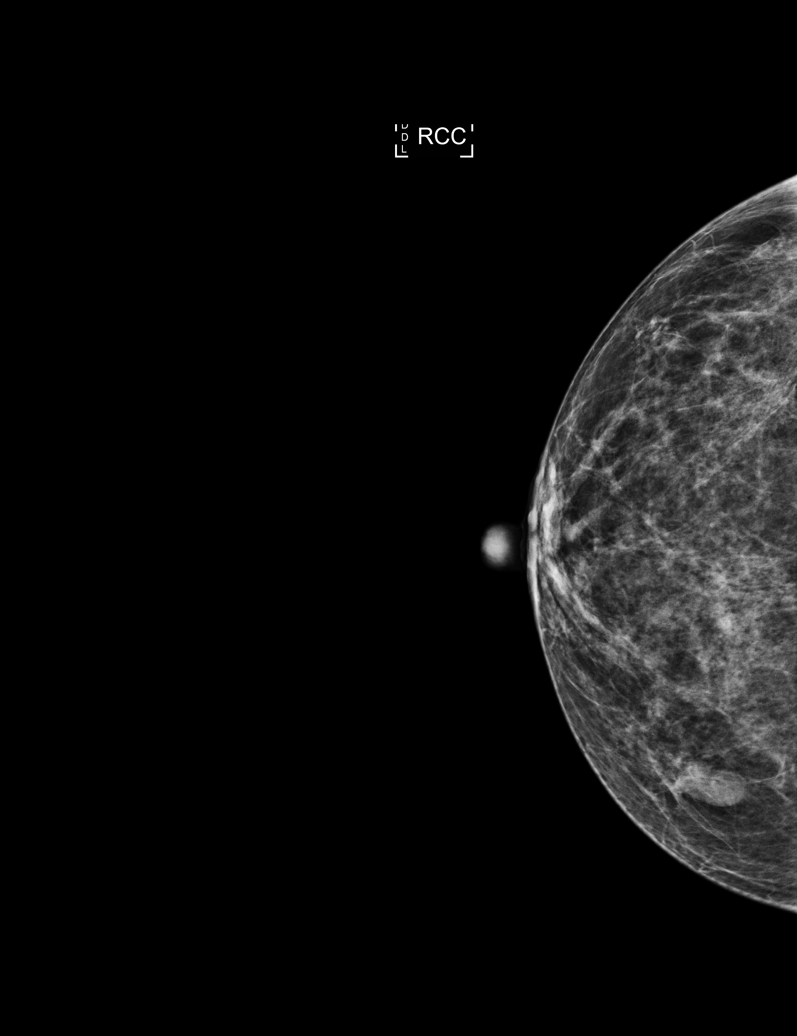

[L MLO]
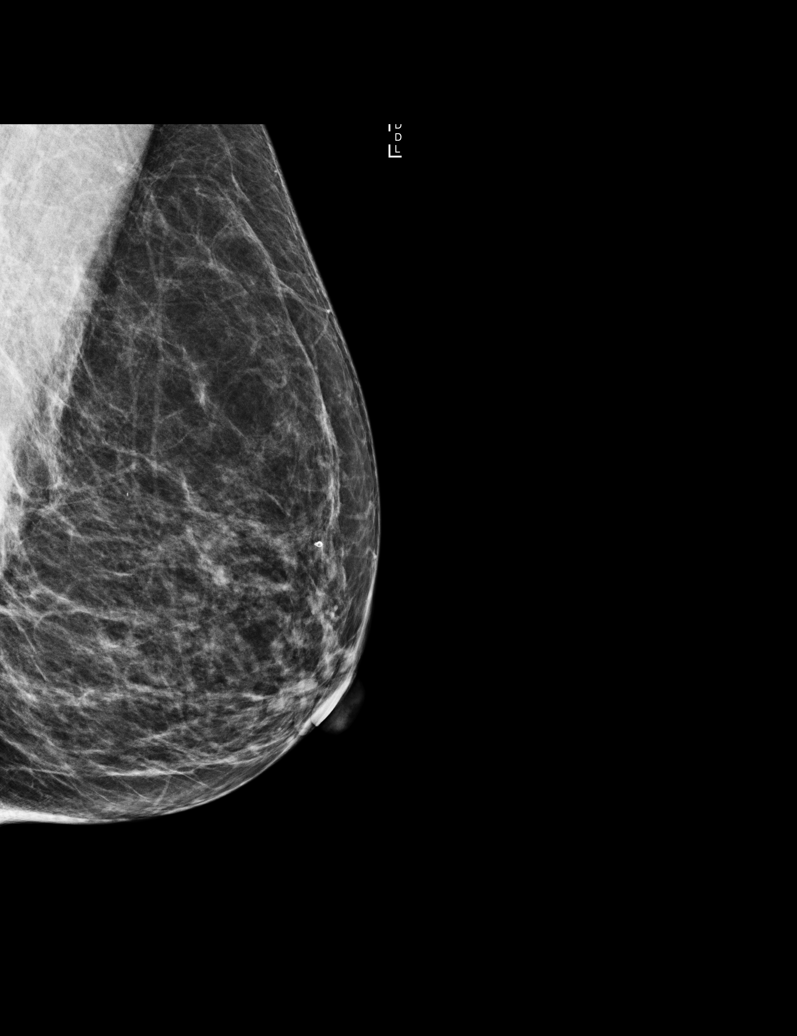

[R MLO]
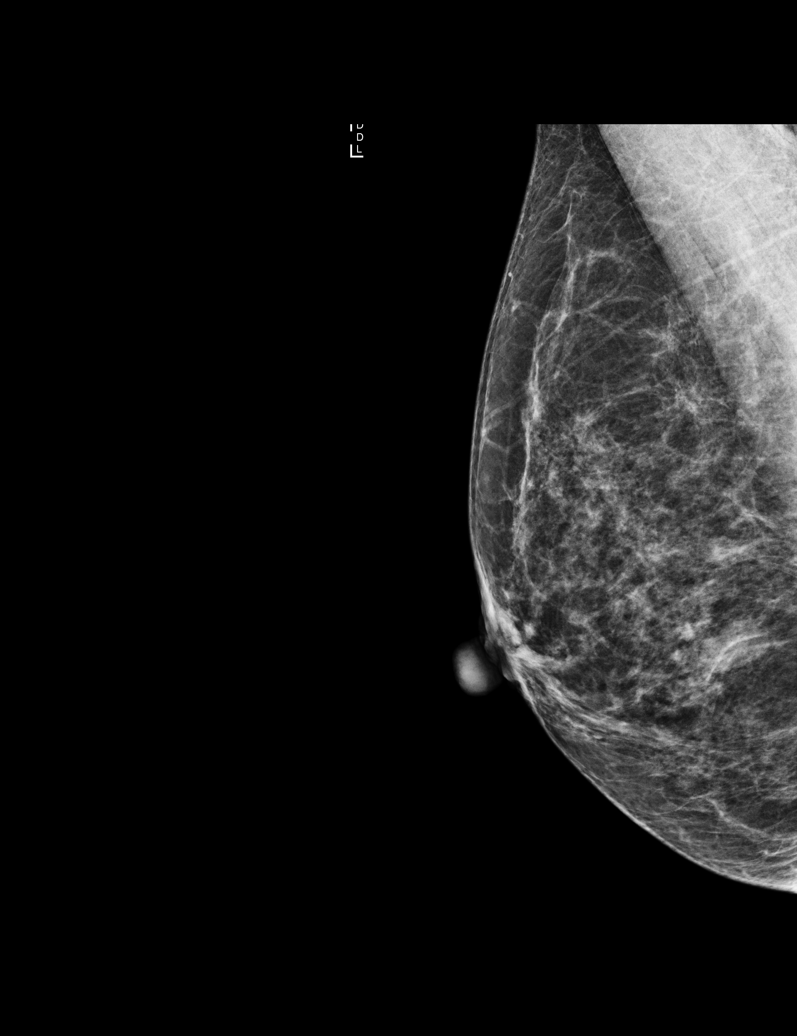

[R CC (2 of 2)]
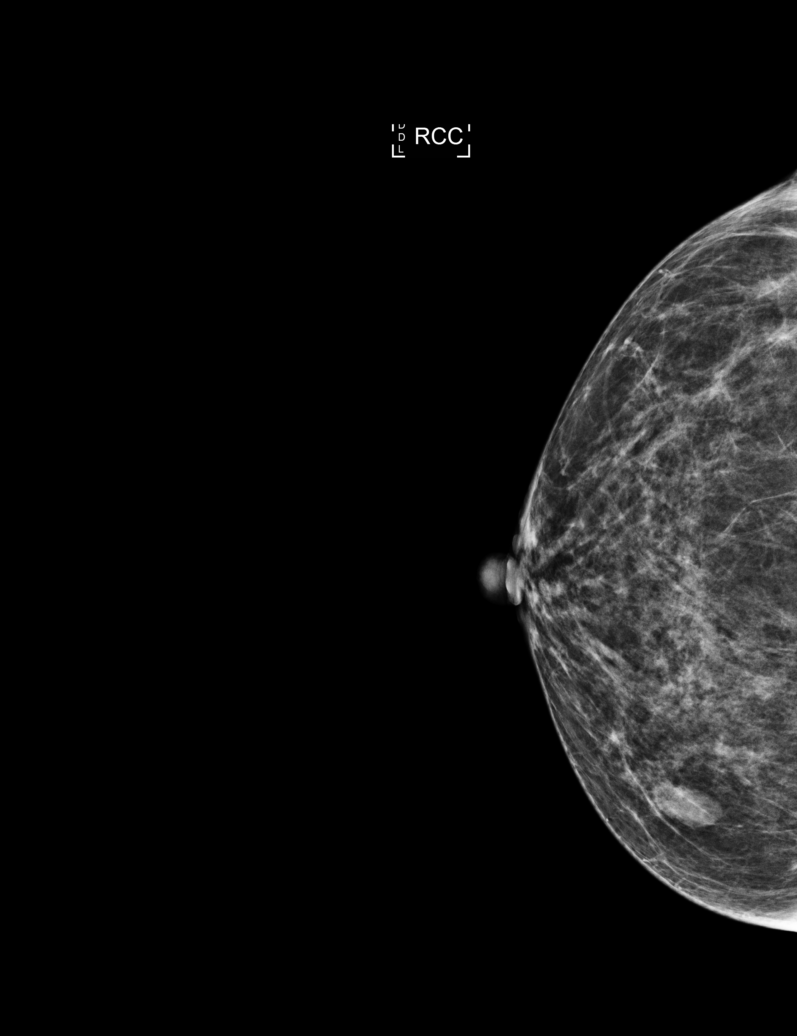

[L CC]
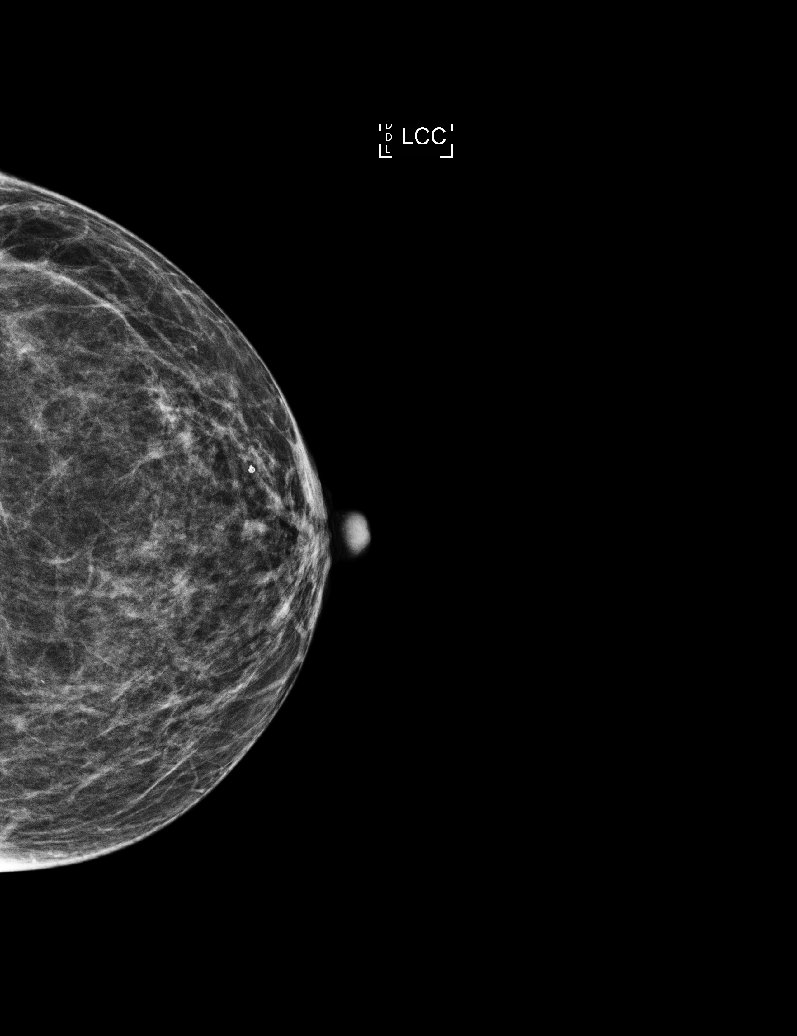

[5 of 5 positions shown; findings below may reference images not displayed]

ACR Breast Density Category b: There are scattered areas of
fibroglandular density.
FINDINGS: In the right breast, a possible mass warrants further evaluation. In
the left breast, no findings suspicious for malignancy. Images were
processed with CAD.
IMPRESSION: Further evaluation is suggested for possible mass in the right
breast.

RECOMMENDATION:
Ultrasound of the right breast. (Code:[id number])

The patient will be contacted regarding the findings, and additional
imaging will be scheduled.

BI-RADS CATEGORY  0: Incomplete. Need additional imaging evaluation
and/or prior mammograms for comparison.

## 2021-04-09 IMAGING — US US BREAST*R* LIMITED INC AXILLA
1 series · 7 of 7 positions shown · non-contrast
Comparison: Baseline screening mammogram dated 03/19/2020.

CLINICAL DATA: Patient was called back from baseline screening
mammogram for a right breast mass.

EXAM:
ULTRASOUND OF THE RIGHT BREAST

[Series 1: us breast*right* limited inc axilla · 0.06mm/px · 7 of 7 slices shown]
[im 1/7]
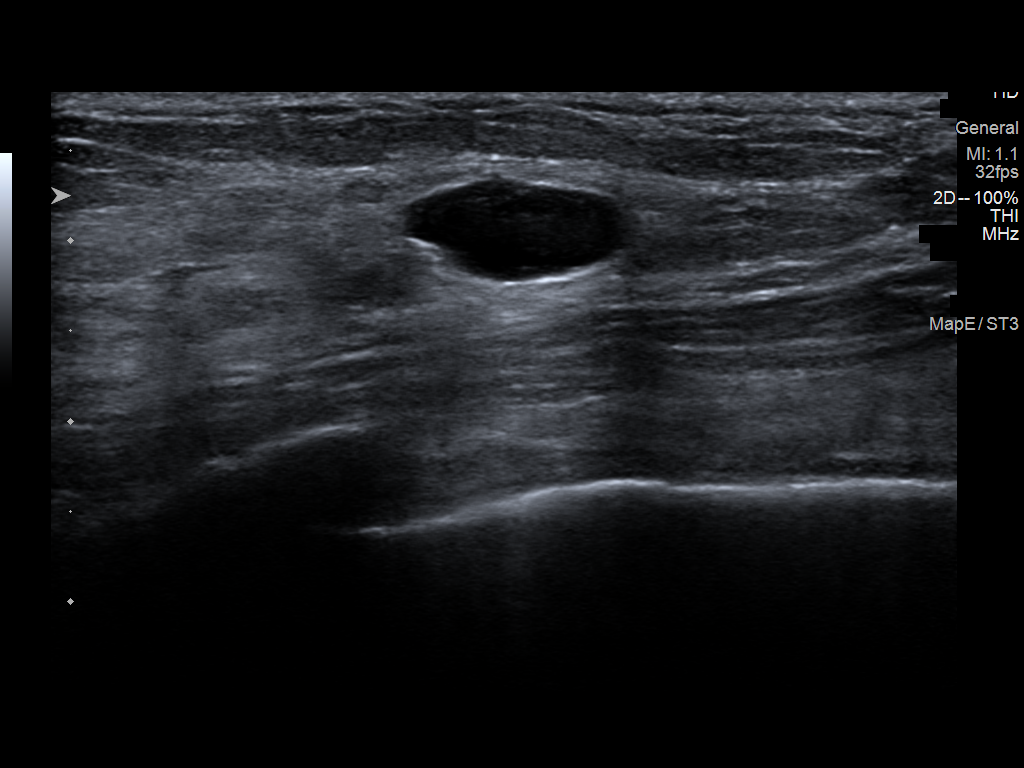
[im 2/7]
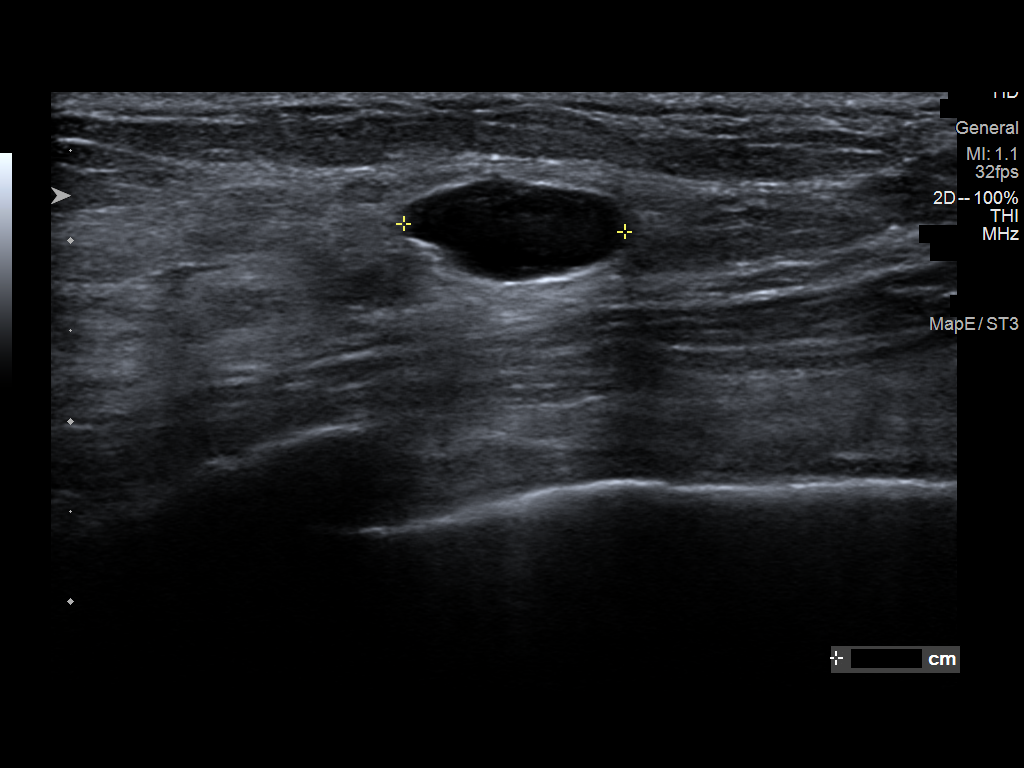
[im 3/7]
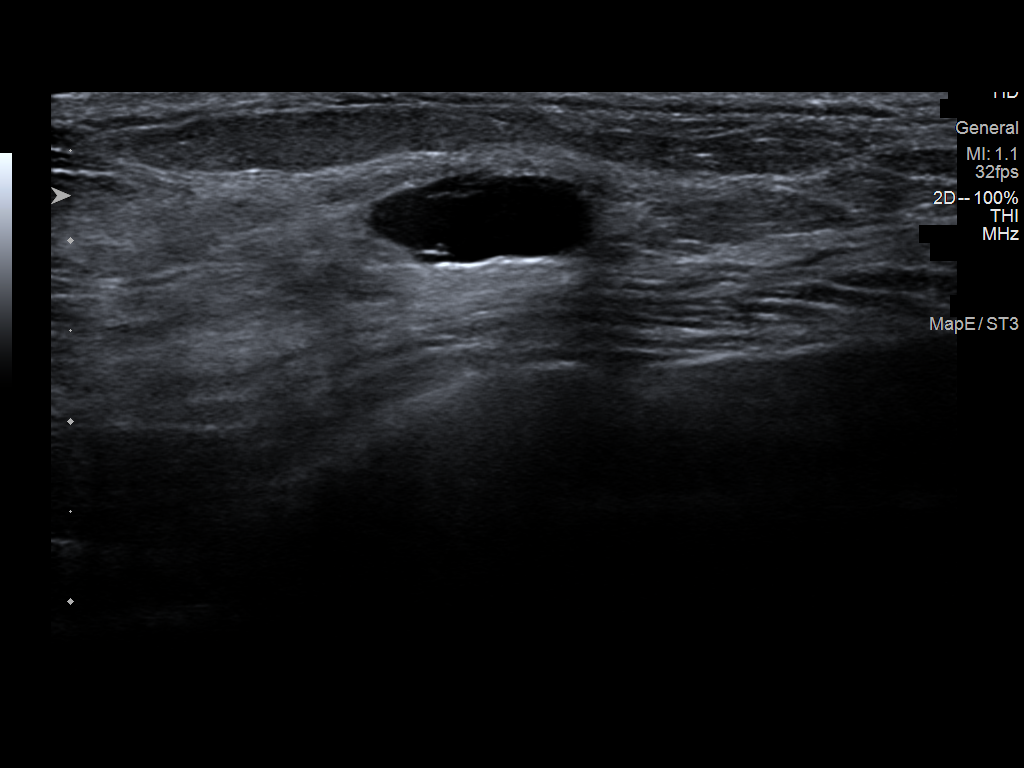
[im 4/7]
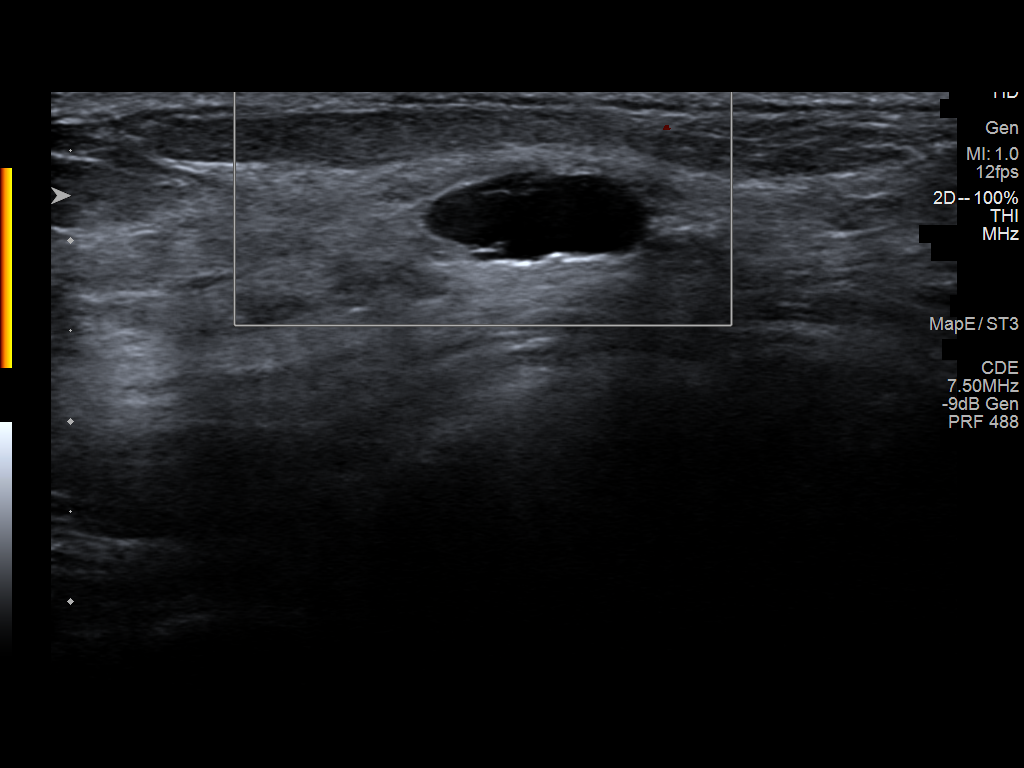
[im 5/7]
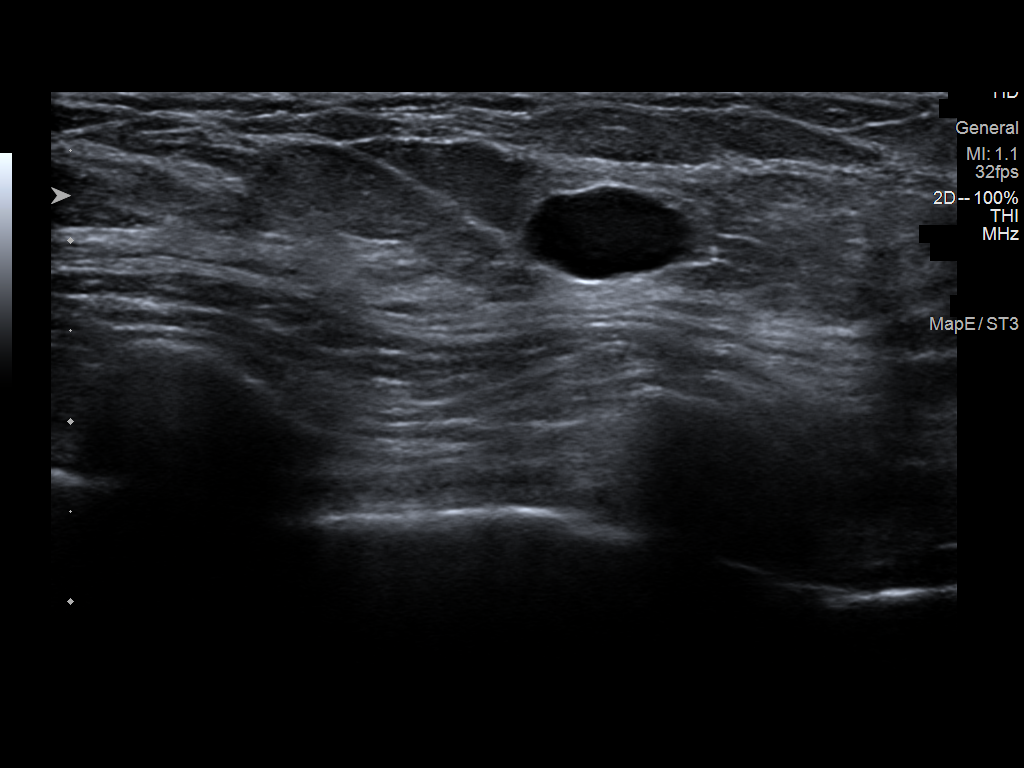
[im 6/7]
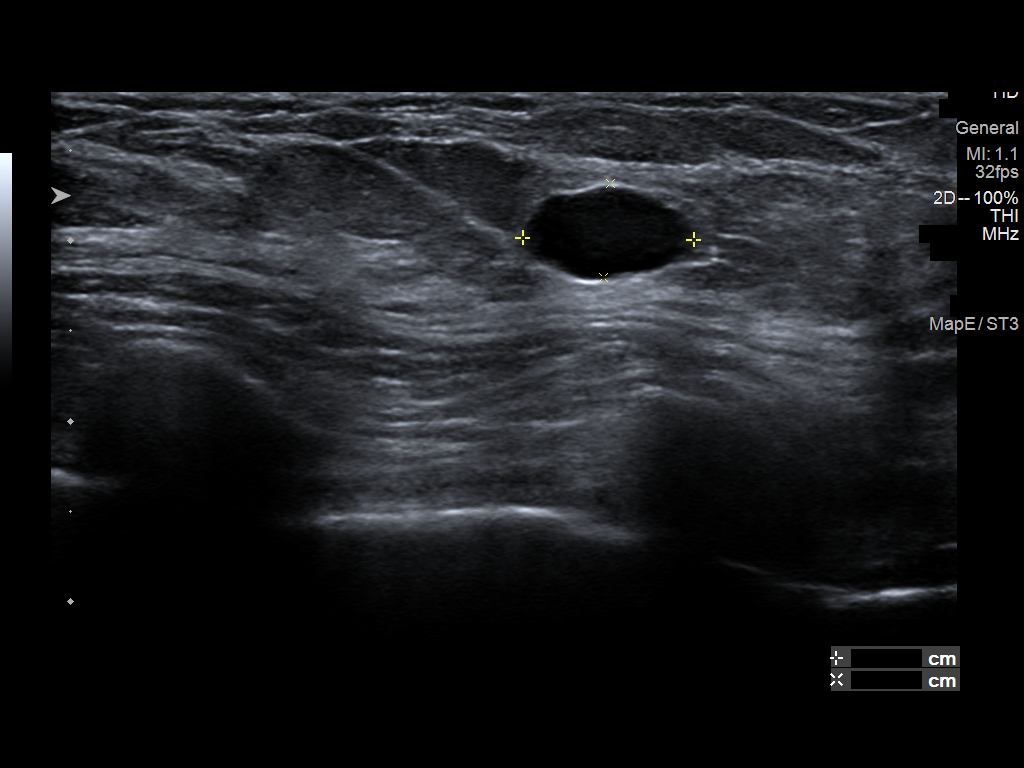
[im 7/7]
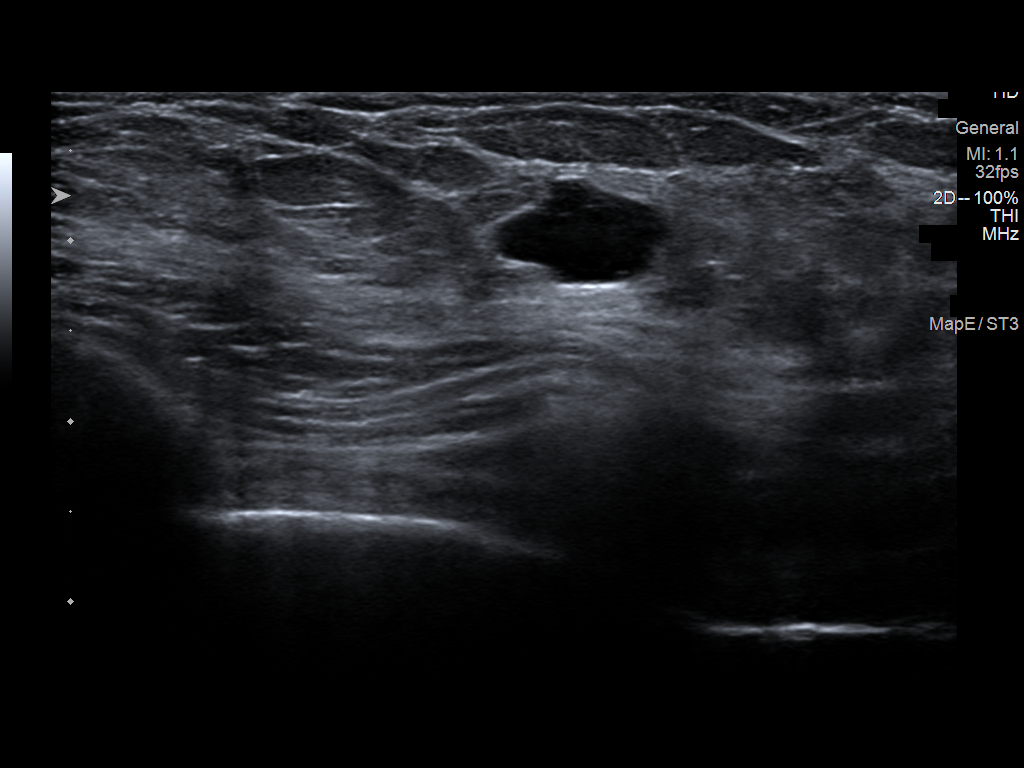

[7 of 7 positions shown; findings below may reference images not displayed]

FINDINGS: Targeted ultrasound is performed, showing a near anechoic
circumscribed mass in the right breast at 3 o'clock 4 cm from the
nipple measuring 1.2 x 1.0 x 0.5 cm. It is likely a cyst with some
internal debris.
IMPRESSION: Probable benign cyst in the right breast.

RECOMMENDATION:
Short-term interval follow-up right breast ultrasound in 6 months is
recommended.

I have discussed the findings and recommendations with the patient.
If applicable, a reminder letter will be sent to the patient
regarding the next appointment.

BI-RADS CATEGORY  3: Probably benign.

## 2021-07-03 ENCOUNTER — Telehealth: Payer: Self-pay

## 2021-07-03 ENCOUNTER — Encounter: Payer: Medicare Other | Admitting: Nurse Practitioner

## 2021-07-03 ENCOUNTER — Ambulatory Visit: Payer: Medicare Other

## 2021-07-03 NOTE — Telephone Encounter (Signed)
This nurse attempted to call patient in regards to missed AWV scheduled for today. Mailbox is full and I am unable to leave a message.

## 2021-07-09 ENCOUNTER — Telehealth: Payer: Self-pay | Admitting: Nurse Practitioner

## 2021-07-09 NOTE — Telephone Encounter (Signed)
Tried calling patient to schedule Medicare Annual Wellness Visit (AWV) either virtually or in office.  Voicemail full    Last AWV 06/26/20 please schedule at anytime with [facility]    This should be a 45 minute visit.

## 2021-07-16 ENCOUNTER — Telehealth: Payer: Self-pay | Admitting: Nurse Practitioner

## 2021-07-16 NOTE — Telephone Encounter (Signed)
Tried calling to schedule Medicare Annual Wellness Visit (AWV) either virtually or in office No answer   Last AWV 06/26/20   please schedule at anytime with [facility]    This should be a 45 minute visit.

## 2021-08-27 ENCOUNTER — Ambulatory Visit (INDEPENDENT_AMBULATORY_CARE_PROVIDER_SITE_OTHER): Payer: Medicare Other

## 2021-08-27 VITALS — Ht 69.0 in | Wt 120.0 lb

## 2021-08-27 DIAGNOSIS — Z0000 Encounter for general adult medical examination without abnormal findings: Secondary | ICD-10-CM

## 2021-08-27 NOTE — Patient Instructions (Signed)
Deanna Harrell , Thank you for taking time to come for your Medicare Wellness Visit. I appreciate your ongoing commitment to your health goals. Please review the following plan we discussed and let me know if I can assist you in the future.   Screening recommendations/referrals: Colonoscopy: completed 04/15/2020 Mammogram: completed 03/19/2020 Bone Density: due Recommended yearly ophthalmology/optometry visit for glaucoma screening and checkup Recommended yearly dental visit for hygiene and checkup  Vaccinations: Influenza vaccine: decline Pneumococcal vaccine: decline Tdap vaccine: decline Shingles vaccine: discussed   Covid-19: 01/23/2021, 02/13/2021  Advanced directives: Advance directive discussed with you today.   Conditions/risks identified: smoking  Next appointment: Follow up in one year for your annual wellness visit    Preventive Care 35 Years and Older, Female Preventive care refers to lifestyle choices and visits with your health care provider that can promote health and wellness. What does preventive care include? A yearly physical exam. This is also called an annual well check. Dental exams once or twice a year. Routine eye exams. Ask your health care provider how often you should have your eyes checked. Personal lifestyle choices, including: Daily care of your teeth and gums. Regular physical activity. Eating a healthy diet. Avoiding tobacco and drug use. Limiting alcohol use. Practicing safe sex. Taking low-dose aspirin every day. Taking vitamin and mineral supplements as recommended by your health care provider. What happens during an annual well check? The services and screenings done by your health care provider during your annual well check will depend on your age, overall health, lifestyle risk factors, and family history of disease. Counseling  Your health care provider may ask you questions about your: Alcohol use. Tobacco use. Drug use. Emotional  well-being. Home and relationship well-being. Sexual activity. Eating habits. History of falls. Memory and ability to understand (cognition). Work and work Statistician. Reproductive health. Screening  You may have the following tests or measurements: Height, weight, and BMI. Blood pressure. Lipid and cholesterol levels. These may be checked every 5 years, or more frequently if you are over 52 years old. Skin check. Lung cancer screening. You may have this screening every year starting at age 63 if you have a 30-pack-year history of smoking and currently smoke or have quit within the past 15 years. Fecal occult blood test (FOBT) of the stool. You may have this test every year starting at age 66. Flexible sigmoidoscopy or colonoscopy. You may have a sigmoidoscopy every 5 years or a colonoscopy every 10 years starting at age 12. Hepatitis C blood test. Hepatitis B blood test. Sexually transmitted disease (STD) testing. Diabetes screening. This is done by checking your blood sugar (glucose) after you have not eaten for a while (fasting). You may have this done every 1-3 years. Bone density scan. This is done to screen for osteoporosis. You may have this done starting at age 7. Mammogram. This may be done every 1-2 years. Talk to your health care provider about how often you should have regular mammograms. Talk with your health care provider about your test results, treatment options, and if necessary, the need for more tests. Vaccines  Your health care provider may recommend certain vaccines, such as: Influenza vaccine. This is recommended every year. Tetanus, diphtheria, and acellular pertussis (Tdap, Td) vaccine. You may need a Td booster every 10 years. Zoster vaccine. You may need this after age 21. Pneumococcal 13-valent conjugate (PCV13) vaccine. One dose is recommended after age 76. Pneumococcal polysaccharide (PPSV23) vaccine. One dose is recommended after age 65. Talk to  your  health care provider about which screenings and vaccines you need and how often you need them. This information is not intended to replace advice given to you by your health care provider. Make sure you discuss any questions you have with your health care provider. Document Released: 01/10/2016 Document Revised: 09/02/2016 Document Reviewed: 10/15/2015 Elsevier Interactive Patient Education  2017 [place] Prevention in the Home Falls can cause injuries. They can happen to people of all ages. There are many things you can do to make your home safe and to help prevent falls. What can I do on the outside of my home? Regularly fix the edges of walkways and driveways and fix any cracks. Remove anything that might make you trip as you walk through a door, such as a raised step or threshold. Trim any bushes or trees on the path to your home. Use bright outdoor lighting. Clear any walking paths of anything that might make someone trip, such as rocks or tools. Regularly check to see if handrails are loose or broken. Make sure that both sides of any steps have handrails. Any raised decks and porches should have guardrails on the edges. Have any leaves, snow, or ice cleared regularly. Use sand or salt on walking paths during winter. Clean up any spills in your garage right away. This includes oil or grease spills. What can I do in the bathroom? Use night lights. Install grab bars by the toilet and in the tub and shower. Do not use towel bars as grab bars. Use non-skid mats or decals in the tub or shower. If you need to sit down in the shower, use a plastic, non-slip stool. Keep the floor dry. Clean up any water that spills on the floor as soon as it happens. Remove soap buildup in the tub or shower regularly. Attach bath mats securely with double-sided non-slip rug tape. Do not have throw rugs and other things on the floor that can make you trip. What can I do in the bedroom? Use night  lights. Make sure that you have a light by your bed that is easy to reach. Do not use any sheets or blankets that are too big for your bed. They should not hang down onto the floor. Have a firm chair that has side arms. You can use this for support while you get dressed. Do not have throw rugs and other things on the floor that can make you trip. What can I do in the kitchen? Clean up any spills right away. Avoid walking on wet floors. Keep items that you use a lot in easy-to-reach places. If you need to reach something above you, use a strong step stool that has a grab bar. Keep electrical cords out of the way. Do not use floor polish or wax that makes floors slippery. If you must use wax, use non-skid floor wax. Do not have throw rugs and other things on the floor that can make you trip. What can I do with my stairs? Do not leave any items on the stairs. Make sure that there are handrails on both sides of the stairs and use them. Fix handrails that are broken or loose. Make sure that handrails are as long as the stairways. Check any carpeting to make sure that it is firmly attached to the stairs. Fix any carpet that is loose or worn. Avoid having throw rugs at the top or bottom of the stairs. If you do have throw rugs, attach  them to the floor with carpet tape. Make sure that you have a light switch at the top of the stairs and the bottom of the stairs. If you do not have them, ask someone to add them for you. What else can I do to help prevent falls? Wear shoes that: Do not have high heels. Have rubber bottoms. Are comfortable and fit you well. Are closed at the toe. Do not wear sandals. If you use a stepladder: Make sure that it is fully opened. Do not climb a closed stepladder. Make sure that both sides of the stepladder are locked into place. Ask someone to hold it for you, if possible. Clearly mark and make sure that you can see: Any grab bars or handrails. First and last  steps. Where the edge of each step is. Use tools that help you move around (mobility aids) if they are needed. These include: Canes. Walkers. Scooters. Crutches. Turn on the lights when you go into a dark area. Replace any light bulbs as soon as they burn out. Set up your furniture so you have a clear path. Avoid moving your furniture around. If any of your floors are uneven, fix them. If there are any pets around you, be aware of where they are. Review your medicines with your doctor. Some medicines can make you feel dizzy. This can increase your chance of falling. Ask your doctor what other things that you can do to help prevent falls. This information is not intended to replace advice given to you by your health care provider. Make sure you discuss any questions you have with your health care provider. Document Released: 10/10/2009 Document Revised: 05/21/2016 Document Reviewed: 01/18/2015 Elsevier Interactive Patient Education  2017 [employer].

## 2021-08-27 NOTE — Progress Notes (Signed)
I connected with [name] today by telephone and verified that I am speaking with the correct person using two identifiers. Location patient: home Location provider: work Persons participating in the virtual visit: Grettell, Lello LPN.   I discussed the limitations, risks, security and privacy concerns of performing an evaluation and management service by telephone and the availability of in person appointments. I also discussed with the patient that there may be a patient responsible charge related to this service. The patient expressed understanding and verbally consented to this telephonic visit.    Interactive audio and video telecommunications were attempted between this provider and patient, however failed, due to patient having technical difficulties OR patient did not have access to video capability.  We continued and completed visit with audio only.     Vital signs may be patient reported or missing.  Subjective:   Deanna Harrell is a 65 y.o. female who presents for Medicare Annual (Subsequent) preventive examination.  Review of Systems     Cardiac Risk Factors include: advanced age (>34mn, >>4women);hypertension;smoking/ tobacco exposure     Objective:    Today's Vitals   08/27/21 0946  Weight: 120 lb (54.4 kg)  Height: '5\' 9"'$  (1.753 m)   Body mass index is 17.72 kg/m.  Advanced Directives 08/27/2021 06/26/2020 10/19/2018 01/24/2018  Does Patient Have a Medical Advance Directive? No No No No  Would patient like information on creating a medical advance directive? - No - Patient declined No - Patient declined No - Patient declined    Current Medications (verified) Outpatient Encounter Medications as of 08/27/2021  Medication Sig   amLODipine (NORVASC) 2.5 MG tablet Take 1 tablet (2.5 mg total) by mouth daily. (Patient not taking: Reported on 08/27/2021)   No facility-administered encounter medications on file as of 08/27/2021.    Allergies  (verified) Patient has no known allergies.   History: Past Medical History:  Diagnosis Date   Hypertension    [place]    Tobacco abuse    History reviewed. No pertinent surgical history. Family History  Problem Relation Age of Onset   Diabetes Mother    Hypertension Mother    Stroke Mother    Hypertension Sister    Hypertension Brother    Colon cancer Neg Hx    Colon polyps Neg Hx    Stomach cancer Neg Hx    Rectal cancer Neg Hx    Esophageal cancer Neg Hx    Social History   Socioeconomic History   Marital status: Single    Spouse name: Not on file   Number of children: 2   Years of education: Not on file   Highest education level: Not on file  Occupational History   Occupation: disability  Tobacco Use   Smoking status: Every Day    Packs/day: 0.25    Years: 47.00    Pack years: 11.75    Types: Cigarettes   Smokeless tobacco: Never   Tobacco comments:    she smokes 1-2 cigarettes   Vaping Use   Vaping Use: Former  Substance and Sexual Activity   Alcohol use: Not Currently    Comment: Recovered alcoholic   Drug use: Yes    Frequency: 2.0 times per week    Types: Marijuana    Comment: LAST SMOKRED 04/14/20   Sexual activity: Not Currently  Other Topics Concern   Not on file  Social History Narrative   Not on file   Social Determinants of Health  Financial Resource Strain: Low Risk    Difficulty of Paying Living Expenses: Not hard at all  Food Insecurity: No Food Insecurity   Worried About Charity fundraiser in the Last Year: Never true   Ran Out of Food in the Last Year: Never true  Transportation Needs: No Transportation Needs   Lack of Transportation (Medical): No   Lack of Transportation (Non-Medical): No  Physical Activity: Inactive   Days of Exercise per Week: 0 days   Minutes of Exercise per Session: 0 min  Stress: No Stress Concern Present   Feeling of Stress : Not at all  Social Connections: Not on file    Tobacco  Counseling Ready to quit: Yes Counseling given: Not Answered Tobacco comments: she smokes 1-2 cigarettes    Clinical Intake:  Pre-visit preparation completed: Yes  Pain : No/denies pain     Nutritional Status: BMI <19  Underweight Nutritional Risks: None Diabetes: No  How often do you need to have someone help you when you read instructions, pamphlets, or other written materials from your doctor or pharmacy?: 1 - Never What is the last grade level you completed in school?: 81yr college  Diabetic? no  Interpreter Needed?: No  Information entered by :: NAllen LPN   Activities of Daily Living In your present state of health, do you have any difficulty performing the following activities: 08/27/2021  Hearing? N  Vision? N  Difficulty concentrating or making decisions? Y  Walking or climbing stairs? N  Dressing or bathing? N  Doing errands, shopping? N  Preparing Food and eating ? N  Using the Toilet? N  In the past six months, have you accidently leaked urine? N  Do you have problems with loss of bowel control? N  Managing your Medications? N  Managing your Finances? N  Housekeeping or managing your Housekeeping? N  Some recent data might be hidden    Patient Care Team: MMinette Brine FNP as PCP - General (General Practice)  Indicate any recent Medical Services you may have received from other than [facility] providers in the past year (date may be approximate).     Assessment:   This is a routine wellness examination for LTiwanna  Hearing/Vision screen No results found.  Dietary issues and exercise activities discussed: Current Exercise Habits: The patient does not participate in regular exercise at present   Goals Addressed             This Visit's Progress    Patient Stated       08/27/2021, no goals       Depression Screen PHQ 2/9 Scores 08/27/2021 06/26/2020 02/08/2020 12/29/2018 10/19/2018  PHQ - 2 Score 0 0 1 0 0  PHQ- 9 Score - - - - 9    Fall  Risk Fall Risk  08/27/2021 06/26/2020 02/08/2020 12/29/2018 10/19/2018  Falls in the past year? 0 0 0 0 No  Risk for fall due to : No Fall Risks Medication side effect - - Medication side effect  Follow up Falls evaluation completed;Education provided;Falls prevention discussed Falls evaluation completed;Education provided;Falls prevention discussed - - -    FALL RISK PREVENTION PERTAINING TO THE HOME:  Any stairs in or around the home? Yes  If so, are there any without handrails? No  Home free of loose throw rugs in walkways, pet beds, electrical cords, etc? Yes  Adequate lighting in your home to reduce risk of falls? Yes   ASSISTIVE DEVICES UTILIZED TO PREVENT FALLS:  Life alert?  No  Use of a cane, walker or w/c? No  Grab bars in the bathroom? No  Shower chair or bench in shower? No  Elevated toilet seat or a handicapped toilet? No   TIMED UP AND GO:  Was the test performed? No .      Cognitive Function:     6CIT Screen 08/27/2021 06/26/2020 10/19/2018  What Year? 0 points 0 points 0 points  What month? 0 points 0 points 0 points  What time? 0 points 3 points 0 points  Count back from 20 0 points 0 points 0 points  Months in reverse 4 points 4 points 0 points  Repeat phrase 8 points 2 points 0 points  Total Score 12 9 0    Immunizations  There is no immunization history on file for this patient.  TDAP status: Due, Education has been provided regarding the importance of this vaccine. Advised may receive this vaccine at local pharmacy or Health Dept. Aware to provide a copy of the vaccination record if obtained from local pharmacy or Health Dept. Verbalized acceptance and understanding.  Flu Vaccine status: Declined, Education has been provided regarding the importance of this vaccine but patient still declined. Advised may receive this vaccine at local pharmacy or Health Dept. Aware to provide a copy of the vaccination record if obtained from local pharmacy or Health Dept.  Verbalized acceptance and understanding.  Pneumococcal vaccine status: Declined,  Education has been provided regarding the importance of this vaccine but patient still declined. Advised may receive this vaccine at local pharmacy or Health Dept. Aware to provide a copy of the vaccination record if obtained from local pharmacy or Health Dept. Verbalized acceptance and understanding.   Covid-19 vaccine status: Completed vaccines  Qualifies for Shingles Vaccine? Yes   Zostavax completed No   Shingrix Completed?: No.    Education has been provided regarding the importance of this vaccine. Patient has been advised to call insurance company to determine out of pocket expense if they have not yet received this vaccine. Advised may also receive vaccine at local pharmacy or Health Dept. Verbalized acceptance and understanding.  Screening Tests Health Maintenance  Topic Date Due   COVID-19 Vaccine (1) Never done   TETANUS/TDAP  Never done   Zoster Vaccines- Shingrix (1 of 2) Never done   DEXA SCAN  Never done   PNA vac Low Risk Adult (1 of 2 - PCV13) Never done   INFLUENZA VACCINE  07/28/2021   MAMMOGRAM  03/19/2022   COLONOSCOPY (Pts 45-49yr Insurance coverage will need to be confirmed)  04/16/2027   Hepatitis C Screening  Completed   HPV VACCINES  Aged Out    Health Maintenance  Health Maintenance Due  Topic Date Due   COVID-19 Vaccine (1) Never done   TETANUS/TDAP  Never done   Zoster Vaccines- Shingrix (1 of 2) Never done   DEXA SCAN  Never done   PNA vac Low Risk Adult (1 of 2 - PCV13) Never done   INFLUENZA VACCINE  07/28/2021    Colorectal cancer screening: Type of screening: Colonoscopy. Completed 04/15/2020. Repeat every 7 years  Mammogram status: Completed 03/19/2020. Repeat every year  Bone Density status: due  Lung Cancer Screening: (Low Dose CT Chest recommended if Age 66-80years, 30 pack-year currently smoking OR have quit w/in 15years.) does not qualify.   Lung  Cancer Screening Referral: no  Additional Screening:  Hepatitis C Screening: does qualify; Completed 08/24/2018  Vision Screening: Recommended annual ophthalmology exams for  early detection of glaucoma and other disorders of the eye. Is the patient up to date with their annual eye exam?  No  Who is the provider or what is the name of the office in which the patient attends annual eye exams? none If pt is not established with a provider, would they like to be referred to a provider to establish care? No .   Dental Screening: Recommended annual dental exams for proper oral hygiene  Community Resource Referral / Chronic Care Management: CRR required this visit?  No   CCM required this visit?  No      Plan:     I have personally reviewed and noted the following in the patient's chart:   Medical and social history Use of alcohol, tobacco or illicit drugs  Current medications and supplements including opioid prescriptions.  Functional ability and status Nutritional status Physical activity Advanced directives List of other physicians Hospitalizations, surgeries, and ER visits in previous 12 months Vitals Screenings to include cognitive, depression, and falls Referrals and appointments  In addition, I have reviewed and discussed with patient certain preventive protocols, quality metrics, and best practice recommendations. A written personalized care plan for preventive services as well as general preventive health recommendations were provided to patient.     Kellie Simmering, LPN   [id number]   Nurse Notes:

## 2022-06-25 ENCOUNTER — Encounter: Payer: Self-pay | Admitting: *Deleted

## 2022-06-25 NOTE — Progress Notes (Signed)
[facility] Quality Team Note  Name: Deanna Harrell Date of Birth: [birth year] MRN: [id number] Date: 06/25/2022  [facility] Quality Team has reviewed this patient's chart, please see recommendations below:  AWV; Patient requests provider to schedule AWV. Please call patient back with appointment details. [facility] Quality Other; (Pt needs mammogram ordered for [employer] gap.  Left message for pt about where she prefers to go.)

## 2022-09-02 ENCOUNTER — Ambulatory Visit (INDEPENDENT_AMBULATORY_CARE_PROVIDER_SITE_OTHER): Payer: 59

## 2022-09-02 VITALS — BP 118/70 | HR 79 | Temp 98.1°F | Ht 67.2 in | Wt 120.6 lb

## 2022-09-02 DIAGNOSIS — Z1231 Encounter for screening mammogram for malignant neoplasm of breast: Secondary | ICD-10-CM | POA: Diagnosis not present

## 2022-09-02 DIAGNOSIS — E2839 Other primary ovarian failure: Secondary | ICD-10-CM

## 2022-09-02 DIAGNOSIS — Z0000 Encounter for general adult medical examination without abnormal findings: Secondary | ICD-10-CM | POA: Diagnosis not present

## 2022-09-02 NOTE — Progress Notes (Signed)
Subjective:   Deanna Harrell is a 66 y.o. female who presents for Medicare Annual (Subsequent) preventive examination.  Review of Systems     Cardiac Risk Factors include: advanced age (>14mn, >>97women);hypertension     Objective:    Today's Vitals   09/02/22 1155  BP: 118/70  Pulse: 79  Temp: 98.1 F (36.7 C)  TempSrc: Oral  SpO2: 97%  Weight: 120 lb 9.6 oz (54.7 kg)  Height: 5' 7.2" (1.707 m)   Body mass index is 18.78 kg/m.     09/02/2022   12:05 PM 08/27/2021    9:51 AM 06/26/2020   12:43 PM 10/19/2018    3:36 PM 01/24/2018   10:28 AM  Advanced Directives  Does Patient Have a Medical Advance Directive? No No No No No  Would patient like information on creating a medical advance directive? No - Patient declined  No - Patient declined No - Patient declined No - Patient declined    Current Medications (verified) Outpatient Encounter Medications as of 09/02/2022  Medication Sig   amLODipine (NORVASC) 2.5 MG tablet Take 1 tablet (2.5 mg total) by mouth daily. (Patient not taking: Reported on 08/27/2021)   No facility-administered encounter medications on file as of 09/02/2022.    Allergies (verified) Patient has no known allergies.   History: Past Medical History:  Diagnosis Date   Hypertension    Substance abuse ([facility]    Tobacco abuse    History reviewed. No pertinent surgical history. Family History  Problem Relation Age of Onset   Diabetes Mother    Hypertension Mother    Stroke Mother    Hypertension Sister    Hypertension Brother    Colon cancer Neg Hx    Colon polyps Neg Hx    Stomach cancer Neg Hx    Rectal cancer Neg Hx    Esophageal cancer Neg Hx    Social History   Socioeconomic History   Marital status: Single    Spouse name: Not on file   Number of children: 2   Years of education: Not on file   Highest education level: Not on file  Occupational History   Occupation: disability  Tobacco Use   Smoking status: Every Day    Packs/day:  0.25    Years: 47.00    Total pack years: 11.75    Types: Cigarettes   Smokeless tobacco: Never   Tobacco comments:    she smokes 1-2 cigarettes   Vaping Use   Vaping Use: Former  Substance and Sexual Activity   Alcohol use: Not Currently    Comment: Recovered alcoholic   Drug use: Yes    Frequency: 2.0 times per week    Types: Marijuana    Comment: LAST SMOKRED 04/14/20   Sexual activity: Not Currently  Other Topics Concern   Not on file  Social History Narrative   Not on file   Social Determinants of Health   Financial Resource Strain: Medium Risk (09/02/2022)   Overall Financial Resource Strain (CARDIA)    Difficulty of Paying Living Expenses: Somewhat hard  Food Insecurity: Food Insecurity Present (09/02/2022)   Hunger Vital Sign    Worried About Running Out of Food in the Last Year: Sometimes true    Ran Out of Food in the Last Year: Sometimes true  Transportation Needs: No Transportation Needs (09/02/2022)   PRAPARE - THydrologist(Medical): No    Lack of Transportation (Non-Medical): No  Physical Activity: Inactive (09/02/2022)  Exercise Vital Sign    Days of Exercise per Week: 0 days    Minutes of Exercise per Session: 0 min  Stress: No Stress Concern Present (08/27/2021)   [place]    Feeling of Stress : Not at all  Social Connections: Not on file    Tobacco Counseling Ready to quit: Not Answered Counseling given: Not Answered Tobacco comments: she smokes 1-2 cigarettes    Clinical Intake:  Pre-visit preparation completed: Yes  Pain : No/denies pain     Nutritional Status: BMI of 19-24  Normal Nutritional Risks: None Diabetes: No  How often do you need to have someone help you when you read instructions, pamphlets, or other written materials from your doctor or pharmacy?: 1 - Never What is the last grade level you completed in school?: 2 1/2 yrs  college  Diabetic? no  Interpreter Needed?: No  Information entered by :: NAllen LPN   Activities of Daily Living    09/02/2022   12:07 PM  In your present state of health, do you have any difficulty performing the following activities:  Hearing? 0  Vision? 1  Comment floater in right eye  Difficulty concentrating or making decisions? 0  Walking or climbing stairs? 0  Dressing or bathing? 0  Doing errands, shopping? 0  Preparing Food and eating ? N  Using the Toilet? N  In the past six months, have you accidently leaked urine? N  Do you have problems with loss of bowel control? N  Managing your Medications? N  Managing your Finances? N  Housekeeping or managing your Housekeeping? N    Patient Care Team: Minette Brine, FNP as PCP - General (General Practice)  Indicate any recent Medical Services you may have received from other than [facility] providers in the past year (date may be approximate).     Assessment:   This is a routine wellness examination for Deanna Harrell.  Hearing/Vision screen Vision Screening - Comments:: No regular eye exams,   Dietary issues and exercise activities discussed: Current Exercise Habits: The patient does not participate in regular exercise at present   Goals Addressed             This Visit's Progress    Patient Stated       09/02/2022, no goals       Depression Screen    09/02/2022   12:06 PM 08/27/2021    9:51 AM 06/26/2020   12:44 PM 02/08/2020    9:28 AM 12/29/2018    2:38 PM 10/19/2018    3:36 PM  PHQ 2/9 Scores  PHQ - 2 Score 1 0 0 1 0 0  PHQ- 9 Score      9    Fall Risk    09/02/2022   12:06 PM 08/27/2021    9:51 AM 06/26/2020   12:43 PM 02/08/2020    9:28 AM 12/29/2018    2:38 PM  Fall Risk   Falls in the past year? 0 0 0 0 0  Number falls in past yr: 0      Injury with Fall? 0      Risk for fall due to : No Fall Risks No Fall Risks Medication side effect    Follow up Falls prevention discussed;Education provided;Falls  evaluation completed Falls evaluation completed;Education provided;Falls prevention discussed Falls evaluation completed;Education provided;Falls prevention discussed      FALL RISK PREVENTION PERTAINING TO THE HOME:  Any stairs in or around the  home? Yes  If so, are there any without handrails? No  Home free of loose throw rugs in walkways, pet beds, electrical cords, etc? Yes  Adequate lighting in your home to reduce risk of falls? Yes   ASSISTIVE DEVICES UTILIZED TO PREVENT FALLS:  Life alert? No  Use of a cane, walker or w/c? No  Grab bars in the bathroom? No  Shower chair or bench in shower? No  Elevated toilet seat or a handicapped toilet? No   TIMED UP AND GO:  Was the test performed? Yes .  Length of time to ambulate 10 feet: 5 sec.   Gait steady and fast without use of assistive device  Cognitive Function:        09/02/2022   12:08 PM 08/27/2021    9:52 AM 06/26/2020   12:45 PM 10/19/2018    3:43 PM  6CIT Screen  What Year? 4 points 0 points 0 points 0 points  What month? 3 points 0 points 0 points 0 points  What time? 0 points 0 points 3 points 0 points  Count back from 20 0 points 0 points 0 points 0 points  Months in reverse 4 points 4 points 4 points 0 points  Repeat phrase 4 points 8 points 2 points 0 points  Total Score 15 points 12 points 9 points 0 points    Immunizations Immunization History  Administered Date(s) Administered   PFIZER(Purple Top)SARS-COV-2 Vaccination 01/23/2021, 02/13/2021    TDAP status: Due, Education has been provided regarding the importance of this vaccine. Advised may receive this vaccine at local pharmacy or Health Dept. Aware to provide a copy of the vaccination record if obtained from local pharmacy or Health Dept. Verbalized acceptance and understanding.  Flu Vaccine status: Declined, Education has been provided regarding the importance of this vaccine but patient still declined. Advised may receive this vaccine at local  pharmacy or Health Dept. Aware to provide a copy of the vaccination record if obtained from local pharmacy or Health Dept. Verbalized acceptance and understanding.  Pneumococcal vaccine status: Declined,  Education has been provided regarding the importance of this vaccine but patient still declined. Advised may receive this vaccine at local pharmacy or Health Dept. Aware to provide a copy of the vaccination record if obtained from local pharmacy or Health Dept. Verbalized acceptance and understanding.   Covid-19 vaccine status: Completed vaccines  Qualifies for Shingles Vaccine? Yes   Zostavax completed No   Shingrix Completed?: No.    Education has been provided regarding the importance of this vaccine. Patient has been advised to call insurance company to determine out of pocket expense if they have not yet received this vaccine. Advised may also receive vaccine at local pharmacy or Health Dept. Verbalized acceptance and understanding.  Screening Tests Health Maintenance  Topic Date Due   DEXA SCAN  Never done   MAMMOGRAM  03/19/2022   COVID-19 Vaccine (3 - Pfizer series) 09/18/2022 (Originally 04/10/2021)   Zoster Vaccines- Shingrix (1 of 2) 12/02/2022 (Originally 08/04/2005)   INFLUENZA VACCINE  03/28/2023 (Originally 07/28/2022)   Pneumonia Vaccine 37+ Years old (1 - PCV) 09/03/2023 (Originally 08[birth year]04/1961)   TETANUS/TDAP  09/03/2023 (Originally 08/04/1974)   COLONOSCOPY (Pts 45-58yr Insurance coverage will need to be confirmed)  04/16/2027   Hepatitis C Screening  Completed   HPV VACCINES  Aged Out    Health Maintenance  Health Maintenance Due  Topic Date Due   DEXA SCAN  Never done   MAMMOGRAM  03/19/2022  Colorectal cancer screening: Type of screening: Colonoscopy. Completed 04/15/2020. Repeat every 7 years  Mammogram status: Ordered today. Pt provided with contact info and advised to call to schedule appt.   Bone Density status: Ordered today. Pt provided with contact info and  advised to call to schedule appt.  Lung Cancer Screening: (Low Dose CT Chest recommended if Age 57-80 years, 30 pack-year currently smoking OR have quit w/in 15years.) does not qualify.   Lung Cancer Screening Referral: no  Additional Screening:  Hepatitis C Screening: does qualify; Completed 08/24/2018  Vision Screening: Recommended annual ophthalmology exams for early detection of glaucoma and other disorders of the eye. Is the patient up to date with their annual eye exam?  No  Who is the provider or what is the name of the office in which the patient attends annual eye exams? none If pt is not established with a provider, would they like to be referred to a provider to establish care? No .   Dental Screening: Recommended annual dental exams for proper oral hygiene  Community Resource Referral / Chronic Care Management: CRR required this visit?  No   CCM required this visit?  No      Plan:     I have personally reviewed and noted the following in the patient's chart:   Medical and social history Use of alcohol, tobacco or illicit drugs  Current medications and supplements including opioid prescriptions. Patient is not currently taking opioid prescriptions. Functional ability and status Nutritional status Physical activity Advanced directives List of other physicians Hospitalizations, surgeries, and ER visits in previous 12 months Vitals Screenings to include cognitive, depression, and falls Referrals and appointments  In addition, I have reviewed and discussed with patient certain preventive protocols, quality metrics, and best practice recommendations. A written personalized care plan for preventive services as well as general preventive health recommendations were provided to patient.     Kellie Simmering, LPN   07/02/7208   Nurse Notes: none

## 2022-09-02 NOTE — Patient Instructions (Signed)
Deanna Harrell , Thank you for taking time to come for your Medicare Wellness Visit. I appreciate your ongoing commitment to your health goals. Please review the following plan we discussed and let me know if I can assist you in the future.   Screening recommendations/referrals: Colonoscopy: completed 4/19//2021, due 04/16/2027 Mammogram: ordered today Bone Density: ordered today Recommended yearly ophthalmology/optometry visit for glaucoma screening and checkup Recommended yearly dental visit for hygiene and checkup  Vaccinations: Influenza vaccine: decline Pneumococcal vaccine: decline Tdap vaccine: decline Shingles vaccine: discussed   Covid-19: 02/13/2021, 01/23/2021  Advanced directives: Advance directive discussed with you today. Even though you declined this today please call our office should you change your mind and we can give you the proper paperwork for you to fill out.  Conditions/risks identified: smoking   Next appointment: Follow up in one year for your annual wellness visit    Preventive Care 21 Years and Older, Female Preventive care refers to lifestyle choices and visits with your health care provider that can promote health and wellness. What does preventive care include? A yearly physical exam. This is also called an annual well check. Dental exams once or twice a year. Routine eye exams. Ask your health care provider how often you should have your eyes checked. Personal lifestyle choices, including: Daily care of your teeth and gums. Regular physical activity. Eating a healthy diet. Avoiding tobacco and drug use. Limiting alcohol use. Practicing safe sex. Taking low-dose aspirin every day. Taking vitamin and mineral supplements as recommended by your health care provider. What happens during an annual well check? The services and screenings done by your health care provider during your annual well check will depend on your age, overall health, lifestyle risk  factors, and family history of disease. Counseling  Your health care provider may ask you questions about your: Alcohol use. Tobacco use. Drug use. Emotional well-being. Home and relationship well-being. Sexual activity. Eating habits. History of falls. Memory and ability to understand (cognition). Work and work Statistician. Reproductive health. Screening  You may have the following tests or measurements: Height, weight, and BMI. Blood pressure. Lipid and cholesterol levels. These may be checked every 5 years, or more frequently if you are over 50 years old. Skin check. Lung cancer screening. You may have this screening every year starting at age 59 if you have a 30-pack-year history of smoking and currently smoke or have quit within the past 15 years. Fecal occult blood test (FOBT) of the stool. You may have this test every year starting at age 87. Flexible sigmoidoscopy or colonoscopy. You may have a sigmoidoscopy every 5 years or a colonoscopy every 10 years starting at age 33. Hepatitis C blood test. Hepatitis B blood test. Sexually transmitted disease (STD) testing. Diabetes screening. This is done by checking your blood sugar (glucose) after you have not eaten for a while (fasting). You may have this done every 1-3 years. Bone density scan. This is done to screen for osteoporosis. You may have this done starting at age 77. Mammogram. This may be done every 1-2 years. Talk to your health care provider about how often you should have regular mammograms. Talk with your health care provider about your test results, treatment options, and if necessary, the need for more tests. Vaccines  Your health care provider may recommend certain vaccines, such as: Influenza vaccine. This is recommended every year. Tetanus, diphtheria, and acellular pertussis (Tdap, Td) vaccine. You may need a Td booster every 10 years. Zoster vaccine. You  may need this after age 44. Pneumococcal 13-valent  conjugate (PCV13) vaccine. One dose is recommended after age 59. Pneumococcal polysaccharide (PPSV23) vaccine. One dose is recommended after age 62. Talk to your health care provider about which screenings and vaccines you need and how often you need them. This information is not intended to replace advice given to you by your health care provider. Make sure you discuss any questions you have with your health care provider. Document Released: 01/10/2016 Document Revised: 09/02/2016 Document Reviewed: 10/15/2015 Elsevier Interactive Patient Education  2017 [employer] Prevention in the Home Falls can cause injuries. They can happen to people of all ages. There are many things you can do to make your home safe and to help prevent falls. What can I do on the outside of my home? Regularly fix the edges of walkways and driveways and fix any cracks. Remove anything that might make you trip as you walk through a door, such as a raised step or threshold. Trim any bushes or trees on the path to your home. Use bright outdoor lighting. Clear any walking paths of anything that might make someone trip, such as rocks or tools. Regularly check to see if handrails are loose or broken. Make sure that both sides of any steps have handrails. Any raised decks and porches should have guardrails on the edges. Have any leaves, snow, or ice cleared regularly. Use sand or salt on walking paths during winter. Clean up any spills in your garage right away. This includes oil or grease spills. What can I do in the bathroom? Use night lights. Install grab bars by the toilet and in the tub and shower. Do not use towel bars as grab bars. Use non-skid mats or decals in the tub or shower. If you need to sit down in the shower, use a plastic, non-slip stool. Keep the floor dry. Clean up any water that spills on the floor as soon as it happens. Remove soap buildup in the tub or shower regularly. Attach bath mats  securely with double-sided non-slip rug tape. Do not have throw rugs and other things on the floor that can make you trip. What can I do in the bedroom? Use night lights. Make sure that you have a light by your bed that is easy to reach. Do not use any sheets or blankets that are too big for your bed. They should not hang down onto the floor. Have a firm chair that has side arms. You can use this for support while you get dressed. Do not have throw rugs and other things on the floor that can make you trip. What can I do in the kitchen? Clean up any spills right away. Avoid walking on wet floors. Keep items that you use a lot in easy-to-reach places. If you need to reach something above you, use a strong step stool that has a grab bar. Keep electrical cords out of the way. Do not use floor polish or wax that makes floors slippery. If you must use wax, use non-skid floor wax. Do not have throw rugs and other things on the floor that can make you trip. What can I do with my stairs? Do not leave any items on the stairs. Make sure that there are handrails on both sides of the stairs and use them. Fix handrails that are broken or loose. Make sure that handrails are as long as the stairways. Check any carpeting to make sure that it is firmly  attached to the stairs. Fix any carpet that is loose or worn. Avoid having throw rugs at the top or bottom of the stairs. If you do have throw rugs, attach them to the floor with carpet tape. Make sure that you have a light switch at the top of the stairs and the bottom of the stairs. If you do not have them, ask someone to add them for you. What else can I do to help prevent falls? Wear shoes that: Do not have high heels. Have rubber bottoms. Are comfortable and fit you well. Are closed at the toe. Do not wear sandals. If you use a stepladder: Make sure that it is fully opened. Do not climb a closed stepladder. Make sure that both sides of the stepladder  are locked into place. Ask someone to hold it for you, if possible. Clearly mark and make sure that you can see: Any grab bars or handrails. First and last steps. Where the edge of each step is. Use tools that help you move around (mobility aids) if they are needed. These include: Canes. Walkers. Scooters. Crutches. Turn on the lights when you go into a dark area. Replace any light bulbs as soon as they burn out. Set up your furniture so you have a clear path. Avoid moving your furniture around. If any of your floors are uneven, fix them. If there are any pets around you, be aware of where they are. Review your medicines with your doctor. Some medicines can make you feel dizzy. This can increase your chance of falling. Ask your doctor what other things that you can do to help prevent falls. This information is not intended to replace advice given to you by your health care provider. Make sure you discuss any questions you have with your health care provider. Document Released: 10/10/2009 Document Revised: 05/21/2016 Document Reviewed: 01/18/2015 Elsevier Interactive Patient Education  2017 [employer].

## 2022-09-04 ENCOUNTER — Telehealth: Payer: Self-pay

## 2022-09-04 ENCOUNTER — Telehealth: Payer: Self-pay | Admitting: Nurse Practitioner

## 2022-09-04 NOTE — Telephone Encounter (Signed)
Please disregard

## 2022-09-04 NOTE — Telephone Encounter (Signed)
Called pt to schedule appt no answer couldn't leave VM

## 2022-09-04 NOTE — Telephone Encounter (Signed)
   Telephone encounter was:  Unsuccessful.  09/04/2022 Name: Jelani Vreeland MRN: [id number] DOB: 1956-04-27  Unsuccessful outbound call made today to assist with:  Transportation Needs   Outreach Attempt:  1st Attempt  Unable to [place], [place] [facility]  [phone] [address], [place], [place] [address] Phone: [phone] Email: [email or web address]

## 2023-06-15 ENCOUNTER — Encounter (HOSPITAL_COMMUNITY): Payer: Self-pay | Admitting: Emergency Medicine

## 2023-06-15 ENCOUNTER — Emergency Department (HOSPITAL_COMMUNITY)
Admission: EM | Admit: 2023-06-15 | Discharge: 2023-06-16 | Disposition: A | Discharge status: Home / Self Care | Payer: 59 | Attending: Emergency Medicine | Admitting: Emergency Medicine

## 2023-06-15 ENCOUNTER — Other Ambulatory Visit: Payer: Self-pay

## 2023-06-15 DIAGNOSIS — I10 Essential (primary) hypertension: Secondary | ICD-10-CM | POA: Insufficient documentation

## 2023-06-15 DIAGNOSIS — R918 Other nonspecific abnormal finding of lung field: Secondary | ICD-10-CM | POA: Insufficient documentation

## 2023-06-15 DIAGNOSIS — K567 Ileus, unspecified: Secondary | ICD-10-CM | POA: Diagnosis not present

## 2023-06-15 DIAGNOSIS — R112 Nausea with vomiting, unspecified: Secondary | ICD-10-CM | POA: Diagnosis present

## 2023-06-15 LAB — CBC
HCT: 45.2 % (ref 36.0–46.0)
Hemoglobin: 14.4 g/dL (ref 12.0–15.0)
MCH: 29.4 pg (ref 26.0–34.0)
MCHC: 31.9 g/dL (ref 30.0–36.0)
MCV: 92.4 fL (ref 80.0–100.0)
Platelets: 223 10*3/uL (ref 150–400)
RBC: 4.89 MIL/uL (ref 3.87–5.11)
RDW: 13.9 % (ref 11.5–15.5)
WBC: 5.7 10*3/uL (ref 4.0–10.5)
nRBC: 0 % (ref 0.0–0.2)

## 2023-06-15 LAB — COMPREHENSIVE METABOLIC PANEL
ALT: 18 U/L (ref 0–44)
AST: 24 U/L (ref 15–41)
Albumin: 4.2 g/dL (ref 3.5–5.0)
Alkaline Phosphatase: 80 U/L (ref 38–126)
Anion gap: 12 (ref 5–15)
BUN: 9 mg/dL (ref 8–23)
CO2: 21 mmol/L — ABNORMAL LOW (ref 22–32)
Calcium: 9.3 mg/dL (ref 8.9–10.3)
Chloride: 102 mmol/L (ref 98–111)
Creatinine, Ser: 0.78 mg/dL (ref 0.44–1.00)
GFR, Estimated: >60 mL/min (ref >60)
Glucose, Bld: 132 mg/dL — ABNORMAL HIGH (ref 70–99)
Potassium: 3.5 mmol/L (ref 3.5–5.1)
Sodium: 135 mmol/L (ref 135–145)
Total Bilirubin: 0.8 mg/dL (ref 0.3–1.2)
Total Protein: 8.2 g/dL — ABNORMAL HIGH (ref 6.5–8.1)

## 2023-06-15 LAB — LIPASE, BLOOD: Lipase: 24 U/L (ref 11–51)

## 2023-06-15 MED ORDER — ONDANSETRON HCL 4 MG/2ML IJ SOLN
4.0000 mg | Freq: Once | INTRAMUSCULAR | Status: AC
  Administered 2023-06-15: 4 mg via INTRAVENOUS
  Filled 2023-06-15: qty 2

## 2023-06-15 MED ORDER — FENTANYL CITRATE PF 50 MCG/ML IJ SOSY
50.0000 ug | PREFILLED_SYRINGE | Freq: Once | INTRAMUSCULAR | Status: AC
  Administered 2023-06-15: 50 ug via INTRAVENOUS
  Filled 2023-06-15: qty 1

## 2023-06-15 NOTE — ED Provider Notes (Signed)
[place] EMERGENCY DEPARTMENT AT [facility] Provider Note   CSN: [id number] Arrival date & time: 06/15/23  2208     History {Add pertinent medical, surgical, social history, OB history to HPI:1} Chief Complaint  Patient presents with   Vomiting   Abdominal Pain    Deanna Harrell is a 67 y.o. female.  The history is provided by the patient and medical records.  Abdominal Pain Associated symptoms: nausea and vomiting    67 year old female with history of hypertension, history of substance abuse in remission, presenting to the ED with abdominal pain.  Patient states over the past few months she has noticed when eating cereal in the morning or a lot of dairy she tends to have abdominal pain.  States over the past 48 hours she has been having abdominal pain, worse this morning after trying to eat a bowl of cereal.  She did have a small bowel movement afterwards but has continued having pain.  She reports trying to drink a protein shake but vomited this up after arrival in the ED.  She denies prior abdominal surgeries.  Endorses chills throughout the day but no fevers.  No meds PTA.  Home Medications Prior to Admission medications   Medication Sig Start Date End Date Taking? Authorizing Provider  amLODipine (NORVASC) 2.5 MG tablet Take 1 tablet (2.5 mg total) by mouth daily. Patient not taking: Reported on 08/27/2021 02/08/20   Arnette Felts, FNP      Allergies    Patient has no known allergies.    Review of Systems   Review of Systems  Gastrointestinal:  Positive for abdominal pain, nausea and vomiting.  All other systems reviewed and are negative.   Physical Exam Updated Vital Signs BP (!) 199/104   Pulse 63   Temp 97.7 F (36.5 C) (Oral)   Resp 20   SpO2 100%  Physical Exam Vitals and nursing note reviewed.  Constitutional:      Appearance: She is well-developed.  HENT:     Head: Normocephalic and atraumatic.  Eyes:     Conjunctiva/sclera: Conjunctivae  normal.     Pupils: Pupils are equal, round, and reactive to light.  Cardiovascular:     Rate and Rhythm: Normal rate and regular rhythm.     Heart sounds: Normal heart sounds.  Pulmonary:     Effort: Pulmonary effort is normal.     Breath sounds: Normal breath sounds.  Abdominal:     General: Bowel sounds are normal.     Palpations: Abdomen is soft.     Tenderness: There is abdominal tenderness in the left upper quadrant.  Musculoskeletal:        General: Normal range of motion.     Cervical back: Normal range of motion.  Skin:    General: Skin is warm and dry.  Neurological:     Mental Status: She is alert and oriented to person, place, and time.     ED Results / Procedures / Treatments   Labs (all labs ordered are listed, but only abnormal results are displayed) Labs Reviewed  LIPASE, BLOOD  COMPREHENSIVE METABOLIC PANEL  CBC  URINALYSIS, ROUTINE W REFLEX MICROSCOPIC    EKG None  Radiology No results found.  Procedures Procedures  {Document cardiac monitor, telemetry assessment procedure when appropriate:1}  Medications Ordered in ED Medications  fentaNYL (SUBLIMAZE) injection 50 mcg (has no administration in time range)  ondansetron (ZOFRAN) injection 4 mg (has no administration in time range)    ED Course/  Medical Decision Making/ A&P   {   Click here for ABCD2, HEART and other calculatorsREFRESH Note before signing :1}                          Medical Decision Making Amount and/or Complexity of Data Reviewed Labs: ordered. Radiology: ordered.  Risk Prescription drug management.   ***  {Document critical care time when appropriate:1} {Document review of labs and clinical decision tools ie heart score, Chads2Vasc2 etc:1}  {Document your independent review of radiology images, and any outside records:1} {Document your discussion with family members, caretakers, and with consultants:1} {Document social determinants of health affecting pt's  care:1} {Document your decision making why or why not admission, treatments were needed:1} Final Clinical Impression(s) / ED Diagnoses Final diagnoses:  None    Rx / DC Orders ED Discharge Orders     None

## 2023-06-15 NOTE — ED Triage Notes (Signed)
Patient presents from urgent care due to general abdominal pain which intensify with palpation. She also complain of chills and vomiting. Her last bowel movement was yesterday and not satisfactory. Today, she took a laxative, but it did not help the abdominal pain. The patient believes pain may have been triggered by eating milk and cereal.

## 2023-06-16 ENCOUNTER — Emergency Department (HOSPITAL_COMMUNITY): Payer: 59

## 2023-06-16 ENCOUNTER — Encounter (HOSPITAL_COMMUNITY): Payer: Self-pay

## 2023-06-16 DIAGNOSIS — K567 Ileus, unspecified: Secondary | ICD-10-CM | POA: Diagnosis not present

## 2023-06-16 LAB — URINALYSIS, ROUTINE W REFLEX MICROSCOPIC
Bacteria, UA: NONE SEEN
Bilirubin Urine: NEGATIVE
Glucose, UA: NEGATIVE mg/dL
Ketones, ur: 20 mg/dL — AB
Nitrite: NEGATIVE
Protein, ur: 100 mg/dL — AB
Specific Gravity, Urine: 1.018 (ref 1.005–1.030)
pH: 6 (ref 5.0–8.0)

## 2023-06-16 MED ORDER — SODIUM CHLORIDE (PF) 0.9 % IJ SOLN
INTRAMUSCULAR | Status: AC
  Filled 2023-06-16: qty 50

## 2023-06-16 MED ORDER — IOHEXOL 300 MG/ML  SOLN
100.0000 mL | Freq: Once | INTRAMUSCULAR | Status: AC | PRN
  Administered 2023-06-16: 75 mL via INTRAVENOUS

## 2023-06-16 MED ORDER — AMLODIPINE BESYLATE 2.5 MG PO TABS: 2.5000 mg | ORAL_TABLET | Freq: Every day | ORAL | 0 refills | Status: DC

## 2023-06-16 MED ORDER — AMLODIPINE BESYLATE 5 MG PO TABS
2.5000 mg | ORAL_TABLET | Freq: Once | ORAL | Status: AC
  Administered 2023-06-16: 2.5 mg via ORAL
  Filled 2023-06-16: qty 1

## 2023-06-16 MED ORDER — IOHEXOL 300 MG/ML  SOLN
60.0000 mL | Freq: Once | INTRAMUSCULAR | Status: AC | PRN
  Administered 2023-06-16: 60 mL via INTRAVENOUS

## 2023-06-16 NOTE — Discharge Instructions (Addendum)
Resume your blood pressure medications, I have sent a new prescription to your pharmacy. As we discussed, it does appear you have a small ileus on exam.  This should improve over the next few days.  Recommended good hydration, gentle diet and progress back to normal as tolerated. You do have some pulmonary nodules on your CT, these will need follow-up in about 3 to 6 months, your primary care doctor can do this for you. Follow-up with your primary care doctor for recheck of your blood pressure. Return to the ED for new or worsening symptoms.

## 2023-06-16 NOTE — ED Notes (Signed)
Patient tolerated P.O. Challenge. Patient denies N/V.

## 2023-06-22 ENCOUNTER — Ambulatory Visit (INDEPENDENT_AMBULATORY_CARE_PROVIDER_SITE_OTHER): Payer: 59 | Admitting: Family Medicine

## 2023-06-22 ENCOUNTER — Encounter: Payer: Self-pay | Admitting: Family Medicine

## 2023-06-22 VITALS — BP 124/80 | HR 88 | Temp 97.9°F | Ht 67.8 in | Wt 110.4 lb

## 2023-06-22 DIAGNOSIS — K5900 Constipation, unspecified: Secondary | ICD-10-CM

## 2023-06-22 DIAGNOSIS — I700 Atherosclerosis of aorta: Secondary | ICD-10-CM

## 2023-06-22 DIAGNOSIS — R918 Other nonspecific abnormal finding of lung field: Secondary | ICD-10-CM | POA: Diagnosis not present

## 2023-06-22 DIAGNOSIS — K567 Ileus, unspecified: Secondary | ICD-10-CM | POA: Diagnosis not present

## 2023-06-22 MED ORDER — BISACODYL 5 MG PO TBEC
5.0000 mg | DELAYED_RELEASE_TABLET | Freq: Every day | ORAL | 1 refills | Status: DC | PRN
Start: 2023-06-22 — End: 2023-07-22

## 2023-06-22 NOTE — Assessment & Plan Note (Signed)
Continue soft diet as recommend; may go to ER if symptoms persists for next 24-48 hours

## 2023-06-22 NOTE — Progress Notes (Signed)
I,Jameka J Llittleton,acting as a Neurosurgeon for [employer], NP.,have documented all relevant documentation on the behalf of Fraser Busche, NP,as directed by  Yurani Fettes Moshe Salisbury, NP while in the presence of Deliyah Muckle, NP.   Subjective:  Patient ID: Deanna Harrell , female    DOB: 12/27/1956 , 67 y.o.   MRN: [id number]  Chief Complaint  Patient presents with   ER F/U    HPI  Patient presents today for a ER f/u. Patient went to [facility] on 06/15/23 where she was diagnosed with Ileus, and some pulmonary nodules were found on her chest CT. Patient was discharged on gentle diet as tolerated. Patient was advised to comply with soups, and pureed diet for now since that is what she has been able to tolerate for the pass few days.She came into the office today stating she is not feeling too well. She reports she ate watermelon and it has caused her stomach to hurt.  Patient's BP has been Within goal at 124/80 today compared to when she was at the ER, she states she has been complaint with taking her BP med of Amlodipine 2.5 mg EVERY DAY.  Patient states she hasn't had a bowel movement in 1 week, she has not been using the stool softener prescribed for her at the ER,states she wasn't given at the pharmacy.   Abdominal Pain This is a recurrent problem. The current episode started 1 to 4 weeks ago. The onset quality is gradual. The problem has been unchanged. The pain is located in the generalized abdominal region. The pain is at a severity of 8/10. The pain is moderate. The quality of the pain is aching and cramping. The abdominal pain does not radiate. Associated symptoms include constipation and nausea. The pain is aggravated by eating. The pain is relieved by Nothing. Prior diagnostic workup includes ultrasound.     Past Medical History:  Diagnosis Date   Hypertension    Substance abuse (HCC)    Tobacco abuse      Family History  Problem Relation Age of Onset   Diabetes Mother    Hypertension Mother     Stroke Mother    Hypertension Sister    Hypertension Brother    Colon cancer Neg Hx    Colon polyps Neg Hx    Stomach cancer Neg Hx    Rectal cancer Neg Hx    Esophageal cancer Neg Hx      Current Outpatient Medications:    amLODipine (NORVASC) 2.5 MG tablet, Take 1 tablet (2.5 mg total) by mouth daily., Disp: 30 tablet, Rfl: 0   bisacodyl (DULCOLAX) 5 MG EC tablet, Take 1 tablet (5 mg total) by mouth daily as needed for moderate constipation., Disp: 30 tablet, Rfl: 1   No Known Allergies   Review of Systems  Constitutional: Negative.   HENT: Negative.    Gastrointestinal:  Positive for abdominal pain, constipation and nausea.  Genitourinary: Negative.      Today's Vitals   06/22/23 1409  BP: 124/80  Pulse: 88  Temp: 97.9 F (36.6 C)  Weight: 110 lb 6.4 oz (50.1 kg)  Height: 5' 7.8" (1.722 m)  PainSc: 8   PainLoc: Abdomen   Body mass index is 16.89 kg/m.  Wt Readings from Last 3 Encounters:  06/22/23 110 lb 6.4 oz (50.1 kg)  09/02/22 120 lb 9.6 oz (54.7 kg)  08/27/21 120 lb (54.4 kg)     Objective:  Physical Exam Cardiovascular:     Rate and Rhythm:  Normal rate and regular rhythm.  Pulmonary:     Effort: Pulmonary effort is normal.  Abdominal:     General: There is distension.     Tenderness: There is abdominal tenderness.  Skin:    General: Skin is warm and dry.  Neurological:     Mental Status: She is alert.  Psychiatric:        Mood and Affect: Mood normal.         Assessment And Plan:  Ileus (HCC) Assessment & Plan: Continue soft diet as recommend; may go to ER if symptoms persists for next 24-48 hours   Pulmonary nodules Assessment & Plan: CT ordered for next 3 months to re-evaluate pulmonary nodules  Orders: -     CT CHEST WO CONTRAST; Future  Aortic atherosclerosis (HCC) Assessment & Plan: Labs next visit ; will start statins   Constipation, unspecified constipation type Assessment & Plan: Recommend use of stool softener as  prescribed  Orders: -     Bisacodyl; Take 1 tablet (5 mg total) by mouth daily as needed for moderate constipation.  Dispense: 30 tablet; Refill: 1     Return in 1 month (on 07/22/2023), or if symptoms worsen or fail to improve, for blood pressure.  Patient was given opportunity to ask questions. Patient verbalized understanding of the plan and was able to repeat key elements of the plan. All questions were answered to their satisfaction.  Derell Bruun Moshe Salisbury, NP  I, Despina Boan Moshe Salisbury, NP, have reviewed all documentation for this visit. The documentation on 06/22/23 for the exam, diagnosis, procedures, and orders are all accurate and complete.   IF YOU HAVE BEEN REFERRED TO A SPECIALIST, IT MAY TAKE 1-2 WEEKS TO SCHEDULE/PROCESS THE REFERRAL. IF YOU HAVE NOT HEARD FROM US/SPECIALIST IN TWO WEEKS, PLEASE GIVE [place] A CALL AT [phone].   THE PATIENT IS ENCOURAGED TO PRACTICE SOCIAL DISTANCING DUE TO THE COVID-19 PANDEMIC.

## 2023-06-22 NOTE — Assessment & Plan Note (Signed)
CT ordered for next 3 months to re-evaluate pulmonary nodules

## 2023-06-22 NOTE — Assessment & Plan Note (Signed)
Recommend use of stool softener as prescribed

## 2023-06-22 NOTE — Assessment & Plan Note (Signed)
Labs next visit ; will start statins

## 2023-06-26 ENCOUNTER — Other Ambulatory Visit: Payer: Self-pay

## 2023-06-26 ENCOUNTER — Inpatient Hospital Stay (HOSPITAL_COMMUNITY)
Admission: EM | Admit: 2023-06-26 | Discharge: 2023-07-04 | DRG: 336 | Disposition: A | Discharge status: Home / Self Care | Payer: 59 | Attending: General Surgery | Admitting: General Surgery

## 2023-06-26 ENCOUNTER — Emergency Department (HOSPITAL_COMMUNITY): Payer: 59

## 2023-06-26 DIAGNOSIS — K5652 Intestinal adhesions [bands] with complete obstruction: Secondary | ICD-10-CM | POA: Diagnosis not present

## 2023-06-26 DIAGNOSIS — E871 Hypo-osmolality and hyponatremia: Secondary | ICD-10-CM | POA: Diagnosis present

## 2023-06-26 DIAGNOSIS — K56609 Unspecified intestinal obstruction, unspecified as to partial versus complete obstruction: Secondary | ICD-10-CM

## 2023-06-26 DIAGNOSIS — N179 Acute kidney failure, unspecified: Secondary | ICD-10-CM | POA: Diagnosis present

## 2023-06-26 DIAGNOSIS — F1411 Cocaine abuse, in remission: Secondary | ICD-10-CM | POA: Diagnosis present

## 2023-06-26 DIAGNOSIS — Z79899 Other long term (current) drug therapy: Secondary | ICD-10-CM

## 2023-06-26 DIAGNOSIS — R918 Other nonspecific abnormal finding of lung field: Secondary | ICD-10-CM | POA: Diagnosis present

## 2023-06-26 DIAGNOSIS — Z823 Family history of stroke: Secondary | ICD-10-CM

## 2023-06-26 DIAGNOSIS — Z8249 Family history of ischemic heart disease and other diseases of the circulatory system: Secondary | ICD-10-CM

## 2023-06-26 DIAGNOSIS — Z833 Family history of diabetes mellitus: Secondary | ICD-10-CM

## 2023-06-26 DIAGNOSIS — R109 Unspecified abdominal pain: Secondary | ICD-10-CM | POA: Diagnosis not present

## 2023-06-26 DIAGNOSIS — K56691 Other complete intestinal obstruction: Principal | ICD-10-CM

## 2023-06-26 DIAGNOSIS — I10 Essential (primary) hypertension: Secondary | ICD-10-CM | POA: Diagnosis present

## 2023-06-26 DIAGNOSIS — F17210 Nicotine dependence, cigarettes, uncomplicated: Secondary | ICD-10-CM | POA: Diagnosis present

## 2023-06-26 LAB — COMPREHENSIVE METABOLIC PANEL
ALT: 22 U/L (ref 0–44)
AST: 34 U/L (ref 15–41)
Albumin: 3.9 g/dL (ref 3.5–5.0)
Alkaline Phosphatase: 71 U/L (ref 38–126)
Anion gap: 15 (ref 5–15)
BUN: 70 mg/dL — ABNORMAL HIGH (ref 8–23)
CO2: 28 mmol/L (ref 22–32)
Calcium: 9.2 mg/dL (ref 8.9–10.3)
Chloride: 91 mmol/L — ABNORMAL LOW (ref 98–111)
Creatinine, Ser: 1.62 mg/dL — ABNORMAL HIGH (ref 0.44–1.00)
GFR, Estimated: 35 mL/min — ABNORMAL LOW (ref >60)
Glucose, Bld: 144 mg/dL — ABNORMAL HIGH (ref 70–99)
Potassium: 3.7 mmol/L (ref 3.5–5.1)
Sodium: 134 mmol/L — ABNORMAL LOW (ref 135–145)
Total Bilirubin: 0.7 mg/dL (ref 0.3–1.2)
Total Protein: 7.7 g/dL (ref 6.5–8.1)

## 2023-06-26 LAB — LIPASE, BLOOD: Lipase: 35 U/L (ref 11–51)

## 2023-06-26 LAB — I-STAT CHEM 8, ED
BUN: 65 mg/dL — ABNORMAL HIGH (ref 8–23)
Calcium, Ion: 1 mmol/L — ABNORMAL LOW (ref 1.15–1.40)
Chloride: 97 mmol/L — ABNORMAL LOW (ref 98–111)
Creatinine, Ser: 1.7 mg/dL — ABNORMAL HIGH (ref 0.44–1.00)
Glucose, Bld: 143 mg/dL — ABNORMAL HIGH (ref 70–99)
HCT: 51 % — ABNORMAL HIGH (ref 36.0–46.0)
Hemoglobin: 17.3 g/dL — ABNORMAL HIGH (ref 12.0–15.0)
Potassium: 3.8 mmol/L (ref 3.5–5.1)
Sodium: 134 mmol/L — ABNORMAL LOW (ref 135–145)
TCO2: 31 mmol/L (ref 22–32)

## 2023-06-26 MED ORDER — MORPHINE SULFATE (PF) 4 MG/ML IV SOLN
4.0000 mg | Freq: Once | INTRAVENOUS | Status: AC
  Administered 2023-06-26: 4 mg via INTRAVENOUS
  Filled 2023-06-26: qty 1

## 2023-06-26 MED ORDER — LACTATED RINGERS IV BOLUS
1000.0000 mL | Freq: Once | INTRAVENOUS | Status: AC
  Administered 2023-06-26: 1000 mL via INTRAVENOUS

## 2023-06-26 MED ORDER — SODIUM CHLORIDE (PF) 0.9 % IJ SOLN
INTRAMUSCULAR | Status: AC
  Filled 2023-06-26: qty 50

## 2023-06-26 MED ORDER — ONDANSETRON HCL 4 MG/2ML IJ SOLN
4.0000 mg | Freq: Once | INTRAMUSCULAR | Status: AC
  Administered 2023-06-26: 4 mg via INTRAVENOUS
  Filled 2023-06-26: qty 2

## 2023-06-26 MED ORDER — IOHEXOL 300 MG/ML  SOLN
75.0000 mL | Freq: Once | INTRAMUSCULAR | Status: AC | PRN
  Administered 2023-06-26: 75 mL via INTRAVENOUS

## 2023-06-26 NOTE — ED Provider Notes (Signed)
[place] EMERGENCY DEPARTMENT AT [facility] Provider Note   CSN: [id number] Arrival date & time: 06/26/23  2128     History {Add pertinent medical, surgical, social history, OB history to HPI:1} Chief Complaint  Patient presents with   Abdominal Pain   Constipation    Deanna Harrell is a 67 y.o. female who presents to the emergency department with abdominal distention, nausea vomiting and lack of bowel movements or flatus x 9 days.  Has been taking bisacodyl at the recommendation of her PCP without success.  Not tolerating p.o. at home.  No history of abdominal surgeries per patient.  Does have history of ileus in the past. NBNB emesis I personally reviewed medical records.  She has history of polysubstance abuse now in remission, hypertension.  Endorses taking amlodipine, endorses compliance.  Accompanied by her son at the bedside.  HPI     Home Medications Prior to Admission medications   Medication Sig Start Date End Date Taking? Authorizing Provider  amLODipine (NORVASC) 2.5 MG tablet Take 1 tablet (2.5 mg total) by mouth daily. 06/16/23  Yes Garlon Hatchet, PA-C  bisacodyl (DULCOLAX) 5 MG EC tablet Take 1 tablet (5 mg total) by mouth daily as needed for moderate constipation. 06/22/23  Yes Ellender Hose, NP      Allergies    Patient has no known allergies.    Review of Systems   Review of Systems  Constitutional:  Positive for appetite change. Negative for chills, diaphoresis, fatigue, fever and unexpected weight change.  HENT: Negative.    Respiratory: Negative.    Cardiovascular: Negative.   Gastrointestinal:  Positive for abdominal distention, abdominal pain, constipation, nausea and vomiting.  Genitourinary: Negative.     Physical Exam Updated Vital Signs BP (!) 139/93 (BP Location: Left Arm)   Pulse 93   Temp 98.3 F (36.8 C) (Oral)   Resp 18   Ht 5\' 7"  (1.702 m)   Wt 49.9 kg   SpO2 95%   BMI 17.23 kg/m  Physical Exam Vitals and nursing  note reviewed.  Constitutional:      Appearance: She is ill-appearing. She is not toxic-appearing.  HENT:     Head: Normocephalic and atraumatic.     Mouth/Throat:     Mouth: Mucous membranes are moist.     Pharynx: No oropharyngeal exudate or posterior oropharyngeal erythema.  Eyes:     General:        Right eye: No discharge.        Left eye: No discharge.     Conjunctiva/sclera: Conjunctivae normal.  Cardiovascular:     Rate and Rhythm: Normal rate and regular rhythm.     Pulses: Normal pulses.     Heart sounds: Normal heart sounds.  Pulmonary:     Effort: Pulmonary effort is normal. No respiratory distress.     Breath sounds: Normal breath sounds. No wheezing or rales.  Abdominal:     General: Bowel sounds are decreased. There is distension.     Palpations: Abdomen is soft.     Tenderness: There is generalized abdominal tenderness. There is no right CVA tenderness, left CVA tenderness, guarding or rebound.  Musculoskeletal:        General: No deformity.     Cervical back: Neck supple.  Skin:    General: Skin is warm and dry.  Neurological:     Mental Status: She is alert. Mental status is at baseline.  Psychiatric:        Mood and  Affect: Mood normal.     ED Results / Procedures / Treatments   Labs (all labs ordered are listed, but only abnormal results are displayed) Labs Reviewed - No data to display  EKG None  Radiology No results found.  Procedures Procedures  {Document cardiac monitor, telemetry assessment procedure when appropriate:1}  Medications Ordered in ED Medications - No data to display  ED Course/ Medical Decision Making/ A&P   {   Click here for ABCD2, HEART and other calculatorsREFRESH Note before signing :1}                          Medical Decision Making 66 year old female presents with abdominal distention and constipation; not tolerating p.o.  Hypertensive on intake, vitals otherwise normal.  Cardiopulmonary sounds normal,  abdominal exam is with abdominal distention global tenderness to palpation, decreased bowel sounds throughout.  No surgical scars visible.  Differential includes is limited to obstruction, fecal impaction, ileus, volvulus, mesenteric ischemia, infectious etiology such as diverticulitis or appendicitis.  Amount and/or Complexity of Data Reviewed Labs: ordered. Radiology: ordered.  Risk Prescription drug management.   ***  {Document critical care time when appropriate:1} {Document review of labs and clinical decision tools ie heart score, Chads2Vasc2 etc:1}  {Document your independent review of radiology images, and any outside records:1} {Document your discussion with family members, caretakers, and with consultants:1} {Document social determinants of health affecting pt's care:1} {Document your decision making why or why not admission, treatments were needed:1} Final Clinical Impression(s) / ED Diagnoses Final diagnoses:  None    Rx / DC Orders ED Discharge Orders     None

## 2023-06-26 NOTE — ED Triage Notes (Signed)
Patient coming to ED for evaluation of abdominal pain.  Reports she is unable to recall her last BM.  Was prescribed "medications to help with the constipation at my doctor's appointment the other day and I haven't been about to have a bowel movement yet."  Has increased oral intake without improvement.  Has not been able to pass gas.  No reports of vomiting.

## 2023-06-27 ENCOUNTER — Inpatient Hospital Stay (HOSPITAL_COMMUNITY): Payer: 59

## 2023-06-27 ENCOUNTER — Encounter (HOSPITAL_COMMUNITY): Payer: Self-pay | Admitting: Internal Medicine

## 2023-06-27 DIAGNOSIS — R109 Unspecified abdominal pain: Secondary | ICD-10-CM | POA: Diagnosis present

## 2023-06-27 DIAGNOSIS — I10 Essential (primary) hypertension: Secondary | ICD-10-CM | POA: Diagnosis present

## 2023-06-27 DIAGNOSIS — K565 Intestinal adhesions [bands], unspecified as to partial versus complete obstruction: Secondary | ICD-10-CM | POA: Diagnosis not present

## 2023-06-27 DIAGNOSIS — Z8249 Family history of ischemic heart disease and other diseases of the circulatory system: Secondary | ICD-10-CM | POA: Diagnosis not present

## 2023-06-27 DIAGNOSIS — Z833 Family history of diabetes mellitus: Secondary | ICD-10-CM | POA: Diagnosis not present

## 2023-06-27 DIAGNOSIS — N179 Acute kidney failure, unspecified: Secondary | ICD-10-CM | POA: Diagnosis present

## 2023-06-27 DIAGNOSIS — E871 Hypo-osmolality and hyponatremia: Secondary | ICD-10-CM | POA: Diagnosis present

## 2023-06-27 DIAGNOSIS — Z79899 Other long term (current) drug therapy: Secondary | ICD-10-CM | POA: Diagnosis not present

## 2023-06-27 DIAGNOSIS — K56609 Unspecified intestinal obstruction, unspecified as to partial versus complete obstruction: Secondary | ICD-10-CM | POA: Diagnosis not present

## 2023-06-27 DIAGNOSIS — R918 Other nonspecific abnormal finding of lung field: Secondary | ICD-10-CM | POA: Diagnosis present

## 2023-06-27 DIAGNOSIS — Z823 Family history of stroke: Secondary | ICD-10-CM | POA: Diagnosis not present

## 2023-06-27 DIAGNOSIS — K5652 Intestinal adhesions [bands] with complete obstruction: Secondary | ICD-10-CM | POA: Diagnosis present

## 2023-06-27 DIAGNOSIS — F1411 Cocaine abuse, in remission: Secondary | ICD-10-CM | POA: Diagnosis present

## 2023-06-27 DIAGNOSIS — F17210 Nicotine dependence, cigarettes, uncomplicated: Secondary | ICD-10-CM | POA: Diagnosis present

## 2023-06-27 LAB — CBC WITH DIFFERENTIAL/PLATELET
Abs Immature Granulocytes: 0.02 10*3/uL (ref 0.00–0.07)
Abs Immature Granulocytes: 0.02 K/uL (ref 0.00–0.07)
Basophils Absolute: 0 10*3/uL (ref 0.0–0.1)
Basophils Absolute: 0 K/uL (ref 0.0–0.1)
Basophils Relative: 0 %
Basophils Relative: 0 %
Eosinophils Absolute: 0 10*3/uL (ref 0.0–0.5)
Eosinophils Absolute: 0 K/uL (ref 0.0–0.5)
Eosinophils Relative: 0 %
Eosinophils Relative: 0 %
HCT: 49.5 % — ABNORMAL HIGH (ref 36.0–46.0)
HCT: 41.7 % (ref 36.0–46.0)
Hemoglobin: 16.2 g/dL — ABNORMAL HIGH (ref 12.0–15.0)
Hemoglobin: 13.4 g/dL (ref 12.0–15.0)
Immature Granulocytes: 0 %
Immature Granulocytes: 0 %
Lymphocytes Relative: 15 %
Lymphocytes Relative: 27 %
Lymphs Abs: 0.9 10*3/uL (ref 0.7–4.0)
Lymphs Abs: 1.6 K/uL (ref 0.7–4.0)
MCH: 29 pg (ref 26.0–34.0)
MCH: 29.1 pg (ref 26.0–34.0)
MCHC: 32.7 g/dL (ref 30.0–36.0)
MCHC: 32.1 g/dL (ref 30.0–36.0)
MCV: 88.6 fL (ref 80.0–100.0)
MCV: 90.7 fL (ref 80.0–100.0)
Monocytes Absolute: 0.6 10*3/uL (ref 0.1–1.0)
Monocytes Absolute: 0.8 K/uL (ref 0.1–1.0)
Monocytes Relative: 9 %
Monocytes Relative: 13 %
Neutro Abs: 4.7 10*3/uL (ref 1.7–7.7)
Neutro Abs: 3.6 K/uL (ref 1.7–7.7)
Neutrophils Relative %: 76 %
Neutrophils Relative %: 60 %
Platelets: 225 10*3/uL (ref 150–400)
Platelets: 181 K/uL (ref 150–400)
RBC: 5.59 MIL/uL — ABNORMAL HIGH (ref 3.87–5.11)
RBC: 4.6 MIL/uL (ref 3.87–5.11)
RDW: 13.3 % (ref 11.5–15.5)
RDW: 13.3 % (ref 11.5–15.5)
WBC: 6.2 10*3/uL (ref 4.0–10.5)
WBC: 6.1 K/uL (ref 4.0–10.5)
nRBC: 0 % (ref 0.0–0.2)
nRBC: 0 % (ref 0.0–0.2)

## 2023-06-27 LAB — URINALYSIS, ROUTINE W REFLEX MICROSCOPIC
Bacteria, UA: NONE SEEN
Bilirubin Urine: NEGATIVE
Glucose, UA: 50 mg/dL — AB
Ketones, ur: 20 mg/dL — AB
Nitrite: NEGATIVE
Protein, ur: NEGATIVE mg/dL
Specific Gravity, Urine: 1.044 — ABNORMAL HIGH (ref 1.005–1.030)
pH: 5 (ref 5.0–8.0)

## 2023-06-27 LAB — COMPREHENSIVE METABOLIC PANEL
ALT: 18 U/L (ref 0–44)
AST: 23 U/L (ref 15–41)
Albumin: 3.2 g/dL — ABNORMAL LOW (ref 3.5–5.0)
Alkaline Phosphatase: 62 U/L (ref 38–126)
Anion gap: 6 (ref 5–15)
BUN: 55 mg/dL — ABNORMAL HIGH (ref 8–23)
CO2: 29 mmol/L (ref 22–32)
Calcium: 8.4 mg/dL — ABNORMAL LOW (ref 8.9–10.3)
Chloride: 98 mmol/L (ref 98–111)
Creatinine, Ser: 1.37 mg/dL — ABNORMAL HIGH (ref 0.44–1.00)
GFR, Estimated: 42 mL/min — ABNORMAL LOW (ref >60)
Glucose, Bld: 110 mg/dL — ABNORMAL HIGH (ref 70–99)
Potassium: 3.8 mmol/L (ref 3.5–5.1)
Sodium: 133 mmol/L — ABNORMAL LOW (ref 135–145)
Total Bilirubin: 0.7 mg/dL (ref 0.3–1.2)
Total Protein: 6.3 g/dL — ABNORMAL LOW (ref 6.5–8.1)

## 2023-06-27 LAB — MAGNESIUM: Magnesium: 2.6 mg/dL — ABNORMAL HIGH (ref 1.7–2.4)

## 2023-06-27 LAB — RAPID URINE DRUG SCREEN, HOSP PERFORMED
Amphetamines: NOT DETECTED
Barbiturates: NOT DETECTED
Benzodiazepines: NOT DETECTED
Cocaine: NOT DETECTED
Opiates: POSITIVE — AB
Tetrahydrocannabinol: POSITIVE — AB

## 2023-06-27 MED ORDER — LORAZEPAM 2 MG/ML IJ SOLN
1.0000 mg | Freq: Four times a day (QID) | INTRAMUSCULAR | Status: DC | PRN
  Administered 2023-06-27 – 2023-07-01 (×4): 1 mg via INTRAVENOUS
  Filled 2023-06-27 (×4): qty 1

## 2023-06-27 MED ORDER — NALOXONE HCL 0.4 MG/ML IJ SOLN: 0.4000 mg | INTRAMUSCULAR | Status: DC | PRN

## 2023-06-27 MED ORDER — HYDROMORPHONE HCL 1 MG/ML IJ SOLN
0.5000 mg | INTRAMUSCULAR | Status: DC | PRN
  Administered 2023-06-27: 0.5 mg via INTRAVENOUS
  Filled 2023-06-27: qty 1

## 2023-06-27 MED ORDER — ACETAMINOPHEN 325 MG PO TABS: 650.0000 mg | ORAL_TABLET | Freq: Four times a day (QID) | ORAL | Status: DC | PRN

## 2023-06-27 MED ORDER — LACTATED RINGERS IV SOLN: INTRAVENOUS | Status: DC

## 2023-06-27 MED ORDER — LACTATED RINGERS IV BOLUS
1000.0000 mL | Freq: Once | INTRAVENOUS | Status: AC
  Administered 2023-06-27: 1000 mL via INTRAVENOUS

## 2023-06-27 MED ORDER — ONDANSETRON HCL 4 MG/2ML IJ SOLN
4.0000 mg | Freq: Four times a day (QID) | INTRAMUSCULAR | Status: DC | PRN
  Administered 2023-06-30 – 2023-07-02 (×3): 4 mg via INTRAVENOUS
  Filled 2023-06-27 (×3): qty 2

## 2023-06-27 MED ORDER — KETOROLAC TROMETHAMINE 15 MG/ML IJ SOLN
15.0000 mg | Freq: Four times a day (QID) | INTRAMUSCULAR | Status: DC | PRN
  Administered 2023-06-28: 15 mg via INTRAVENOUS
  Filled 2023-06-27: qty 1

## 2023-06-27 MED ORDER — HYDROMORPHONE HCL 1 MG/ML IJ SOLN: 0.5000 mg | INTRAMUSCULAR | Status: DC | PRN

## 2023-06-27 MED ORDER — ACETAMINOPHEN 650 MG RE SUPP: 650.0000 mg | Freq: Four times a day (QID) | RECTAL | Status: DC | PRN

## 2023-06-27 MED ORDER — ENOXAPARIN SODIUM 40 MG/0.4ML IJ SOSY
40.0000 mg | PREFILLED_SYRINGE | INTRAMUSCULAR | Status: DC
  Administered 2023-06-27: 40 mg via SUBCUTANEOUS
  Filled 2023-06-27: qty 0.4

## 2023-06-27 MED ORDER — DIATRIZOATE MEGLUMINE & SODIUM 66-10 % PO SOLN
90.0000 mL | Freq: Once | ORAL | Status: AC
  Administered 2023-06-27: 90 mL via NASOGASTRIC
  Filled 2023-06-27: qty 90

## 2023-06-27 MED ORDER — PANTOPRAZOLE SODIUM 40 MG IV SOLR
40.0000 mg | INTRAVENOUS | Status: DC
  Administered 2023-06-27 – 2023-06-30 (×4): 40 mg via INTRAVENOUS
  Filled 2023-06-27 (×4): qty 10

## 2023-06-27 NOTE — H&P (Signed)
History and Physical    Patient: Deanna Harrell WUJ:[id number] DOB: October 06, 1956 DOA: 06/26/2023 DOS: the patient was seen and examined on 06/27/2023 PCP: Ellender Hose, NP  Patient coming from: Home  Chief Complaint:  Chief Complaint  Patient presents with   Abdominal Pain   Constipation   HPI: Deanna Harrell is a 67 y.o. female with medical history significant of hypertension, substance abuse consisting of cocaine and tobacco.  Patient recently presented to the ED on 6/18 with abdominal pain and nausea and vomiting.  CT revealed ileus.  Patient's symptoms improved after supportive care and she was eventually discharged home with recommendations to take Colace.  She does have a history of constipation.  Unfortunately patient's symptoms returned on 6/29 and she returned to the ER for further evaluation and treatment.  In the ED was afebrile, mildly hypertensive, O2 sats were normal.  Renal function was elevated with a creatinine of 1.7, potassium was normal, BUN elevated at 70.  White count was normal.  Patient hemoglobin was elevated at 16.2.  Urinalysis was consistent with hydration with hazy appearance, small amount of hemoglobin, 20 of ketones, and a specific gravity of 1.044.  Urine drug screen was positive for opiates and THC.  CT of abdomen and pelvis revealed small bowel obstruction with transition point within the right upper quadrant anterior to the right hepatic lobe.  Also incidental finding of multiple right basilar indeterminate pulmonary nodules measuring up to 14 mm and previously seen on prior CT recommendation from radiologist to follow-up imaging in 3 months.  Of note patient denies prior abdominal surgical procedures.  Dr. Corliss Skains with the surgical team recommended NG tube to low wall suction as well as small bowel obstruction protocol with Gastrografin.  It is noted that if she has no improvement in 1 to 2 days she may need surgical exploration.  Hospitalists service has been asked to  evaluate the patient for admission.   Review of Systems: As mentioned in the history of present illness. All other systems reviewed and are negative. Past Medical History:  Diagnosis Date   Hypertension    Substance abuse (HCC)    Tobacco abuse    No past surgical history on file. Social History:  reports that she has been smoking cigarettes. She has a 11.75 pack-year smoking history. She has never used smokeless tobacco. She reports that she does not currently use alcohol. She reports current drug use. Frequency: 2.00 times per week. Drug: Marijuana.  No Known Allergies  Family History  Problem Relation Age of Onset   Diabetes Mother    Hypertension Mother    Stroke Mother    Hypertension Sister    Hypertension Brother    Colon cancer Neg Hx    Colon polyps Neg Hx    Stomach cancer Neg Hx    Rectal cancer Neg Hx    Esophageal cancer Neg Hx     Prior to Admission medications   Medication Sig Start Date End Date Taking? Authorizing Provider  amLODipine (NORVASC) 2.5 MG tablet Take 1 tablet (2.5 mg total) by mouth daily. 06/16/23  Yes Garlon Hatchet, PA-C  bisacodyl (DULCOLAX) 5 MG EC tablet Take 1 tablet (5 mg total) by mouth daily as needed for moderate constipation. 06/22/23  Yes Ellender Hose, NP    Physical Exam: Vitals:   06/27/23 0739 06/27/23 0741 06/27/23 0741 06/27/23 0742  BP: (!) 142/84     Pulse:      Resp:  18    Temp:  98.2 F (36.8 C)   TempSrc:   Oral   SpO2:    97%  Weight:      Height:       Constitutional: NAD, calm, comfortable-she is sleepy Respiratory: clear to auscultation bilaterally, no wheezing, no crackles. Normal respiratory effort. No accessory muscle use.  Room air Cardiovascular: Regular rate and rhythm, no murmurs / rubs / gallops. No extremity edema. 2+ pedal pulses.  Abdomen: no tenderness patient, no masses palpated. No hepatosplenomegaly. Bowel sounds positive.  NG tube to low wall suction with bilious returns  noted Musculoskeletal: no clubbing / cyanosis. No joint deformity upper and lower extremities. Good ROM, no contractures. Normal muscle tone.  Skin: no rashes, lesions, ulcers. No induration Neurologic: CN 2-12 grossly intact. Sensation intact,Strength 5/5 x all 4 extremities.  Psychiatric: Normal judgment and insight.  Sleepy but oriented x 3. Normal mood.     Data Reviewed:  Per HPI  Assessment and Plan: Small bowel obstruction Appreciate surgical team assistance Continue bowel rest with NG tube to low wall suction, n.p.o. but is allowed to have ice chips Small bowel protocol with Gastrografin pending According to surgical team if no improvement in 1 to 2 days may require surgical exploration Continue pain management with narcotic as well as nonnarcotic analgesics  Acute kidney injury secondary to volume depletion Baseline creatinine 0.78 Presenting creatinine 1.7 with a BUN of 70 but has improved to 1.37 and 55 after hydration Continue gentle IV fluid hydration  Substance abuse UDS positive for opiates and THC Known history of prior cocaine use Continues to use tobacco products  Pulmonary nodules Seen on CT scan with radiologist recommending short-term follow-up in 3 months to determine stability    Advance Care Planning:   Code Status: Full Code   VTE prophylaxis: Lovenox  Consults: Surgery  Family Communication: Patient only  Severity of Illness: The appropriate patient status for this patient is INPATIENT. Inpatient status is judged to be reasonable and necessary in order to provide the required intensity of service to ensure the patient's safety. The patient's presenting symptoms, physical exam findings, and initial radiographic and laboratory data in the context of their chronic comorbidities is felt to place them at high risk for further clinical deterioration. Furthermore, it is not anticipated that the patient will be medically stable for discharge from the  hospital within 2 midnights of admission.   * I certify that at the point of admission it is my clinical judgment that the patient will require inpatient hospital care spanning beyond 2 midnights from the point of admission due to high intensity of service, high risk for further deterioration and high frequency of surveillance required.*  Author: Junious Silk, NP 06/27/2023 7:47 AM  For on call review [email or web address].

## 2023-06-27 NOTE — Progress Notes (Signed)
  Carryover admission to the Day Admitter.  I discussed this case with the EDP, Loleta Dicker, PA.  Per these discussions:   This is a 67 year old female with no history of prior abdominal surgeries, who is being admitted with distal small bowel obstruction after presenting with abdominal discomfort, more prominent on the right portion of the abdomen, associated with recent nausea/vomiting and resultant decline in oral intake.  She was reported to have been seen in the ED on 06/15/2023, diagnosed with an early ileus at that time.  But notes that her abdominal discomfort and associated symptoms have intensified, as above, since that initial presentation.  Labs are notable for acute kidney injury, with presenting creatinine 1.70 relative to baseline closer to 0.7-0.8.  EDP has requested consult from on-call general surgery, Dr. Corliss Skains, including for recommendations regarding need for NG tube  I have placed an order for inpatient admission to med/tele for further evaluation management of the above.  I have placed some additional preliminary admit orders via the adult multi-morbid admission order set. I have also ordered n.p.o., prn IV Dilaudid, prn Zofran, and continues lactated Ringer's running at 100 cc/h.  Have also ordered morning labs in the form of CMP, CBC, and serum magnesium level.      Newton Pigg, DO Hospitalist

## 2023-06-27 NOTE — ED Notes (Signed)
ED TO INPATIENT HANDOFF REPORT  S Name/Age/Gender Deanna Harrell 67 y.o. female Room/Bed: RESA/RESA  Code Status   Code Status: Full Code  Home/SNF/Other Home Patient oriented to: self, place, time, and situation Is this baseline? Yes   Triage Complete: Triage complete  Chief Complaint SBO (small bowel obstruction) (HCC) [K56.609]  Triage Note Patient coming to ED for evaluation of abdominal pain.  Reports she is unable to recall her last BM.  Was prescribed "medications to help with the constipation at my doctor's appointment the other day and I haven't been about to have a bowel movement yet."  Has increased oral intake without improvement.  Has not been able to pass gas.  No reports of vomiting.    Allergies No Known Allergies  Level of Care/Admitting Diagnosis ED Disposition     ED Disposition  Admit   Condition  --   Comment  Hospital Area: [facility] [[id number]]  Level of Care: Telemetry [5]  Admit to tele based on following criteria: Monitor for Ischemic changes  May admit patient to [facility] or [facility] if equivalent level of care is available:: No  Covid Evaluation: Asymptomatic - no recent exposure (last 10 days) testing not required  Diagnosis: SBO (small bowel obstruction) [facility]) [[id number]]  Admitting Physician: Angie Fava [[id number]]  Attending Physician: Angie Fava [[id number]]  Certification:: I certify this patient will need inpatient services for at least 2 midnights  Estimated Length of Stay: 2          B Medical/Surgery History Past Medical History:  Diagnosis Date   Hypertension    Substance abuse (HCC)    Tobacco abuse    No past surgical history on file.   A IV Location/Drains/Wounds Patient Lines/Drains/Airways Status     Active Line/Drains/Airways     Name Placement date Placement time Site Days   Peripheral IV 06/26/23 20 G Right Antecubital 06/26/23  2300  Antecubital  1             Intake/Output Last 24 hours No intake or output data in the 24 hours ending 06/27/23 2956  Labs/Imaging Results for orders placed or performed during the hospital encounter of 06/26/23 (from the past 48 hour(s))  CBC with Differential     Status: Abnormal   Collection Time: 06/26/23 10:56 PM  Result Value Ref Range   WBC 6.2 4.0 - 10.5 K/uL   RBC 5.59 (H) 3.87 - 5.11 MIL/uL   Hemoglobin 16.2 (H) 12.0 - 15.0 g/dL   HCT 21.3 (H) 08.6 - 57.8 %   MCV 88.6 80.0 - 100.0 fL   MCH 29.0 26.0 - 34.0 pg   MCHC 32.7 30.0 - 36.0 g/dL   RDW 46.9 62.9 - 52.8 %   Platelets 225 150 - 400 K/uL   nRBC 0.0 0.0 - 0.2 %   Neutrophils Relative % 76 %   Neutro Abs 4.7 1.7 - 7.7 K/uL   Lymphocytes Relative 15 %   Lymphs Abs 0.9 0.7 - 4.0 K/uL   Monocytes Relative 9 %   Monocytes Absolute 0.6 0.1 - 1.0 K/uL   Eosinophils Relative 0 %   Eosinophils Absolute 0.0 0.0 - 0.5 K/uL   Basophils Relative 0 %   Basophils Absolute 0.0 0.0 - 0.1 K/uL   Immature Granulocytes 0 %   Abs Immature Granulocytes 0.02 0.00 - 0.07 K/uL    Comment: Performed at [facility], [address], [place], [place] [address]  Comprehensive metabolic  panel     Status: Abnormal   Collection Time: 06/26/23 10:56 PM  Result Value Ref Range   Sodium 134 (L) 135 - 145 mmol/L   Potassium 3.7 3.5 - 5.1 mmol/L   Chloride 91 (L) 98 - 111 mmol/L   CO2 28 22 - 32 mmol/L   Glucose, Bld 144 (H) 70 - 99 mg/dL    Comment: Glucose reference range applies only to samples taken after fasting for at least 8 hours.   BUN 70 (H) 8 - 23 mg/dL   Creatinine, Ser 1.47 (H) 0.44 - 1.00 mg/dL   Calcium 9.2 8.9 - 82.9 mg/dL   Total Protein 7.7 6.5 - 8.1 g/dL   Albumin 3.9 3.5 - 5.0 g/dL   AST 34 15 - 41 U/L   ALT 22 0 - 44 U/L   Alkaline Phosphatase 71 38 - 126 U/L   Total Bilirubin 0.7 0.3 - 1.2 mg/dL   GFR, Estimated 35 (L) >60 mL/min    Comment: (NOTE) Calculated using the CKD-EPI Creatinine Equation (2021)    Anion gap  15 5 - 15    Comment: Performed at [facility], [address], [place], [place] [address]  Lipase, blood     Status: None   Collection Time: 06/26/23 10:56 PM  Result Value Ref Range   Lipase 35 11 - 51 U/L    Comment: Performed at [facility], [address], [place], [place] [address]  I-stat chem 8, ED (not at [facility], [facility] or [facility])     Status: Abnormal   Collection Time: 06/26/23 11:20 PM  Result Value Ref Range   Sodium 134 (L) 135 - 145 mmol/L   Potassium 3.8 3.5 - 5.1 mmol/L   Chloride 97 (L) 98 - 111 mmol/L   BUN 65 (H) 8 - 23 mg/dL   Creatinine, Ser 8.46 (H) 0.44 - 1.00 mg/dL   Glucose, Bld 962 (H) 70 - 99 mg/dL    Comment: Glucose reference range applies only to samples taken after fasting for at least 8 hours.   Calcium, Ion 1.00 (L) 1.15 - 1.40 mmol/L   TCO2 31 22 - 32 mmol/L   Hemoglobin 17.3 (H) 12.0 - 15.0 g/dL   HCT 95.2 (H) 84.1 - 32.4 %  Urinalysis, Routine w reflex microscopic -Urine, Clean Catch     Status: Abnormal   Collection Time: 06/27/23  1:25 AM  Result Value Ref Range   Color, Urine YELLOW YELLOW   APPearance HAZY (A) CLEAR   Specific Gravity, Urine 1.044 (H) 1.005 - 1.030   pH 5.0 5.0 - 8.0   Glucose, UA 50 (A) NEGATIVE mg/dL   Hgb urine dipstick SMALL (A) NEGATIVE   Bilirubin Urine NEGATIVE NEGATIVE   Ketones, ur 20 (A) NEGATIVE mg/dL   Protein, ur NEGATIVE NEGATIVE mg/dL   Nitrite NEGATIVE NEGATIVE   Leukocytes,Ua TRACE (A) NEGATIVE   RBC / HPF 0-5 0 - 5 RBC/hpf   WBC, UA 6-10 0 - 5 WBC/hpf   Bacteria, UA NONE SEEN NONE SEEN   Squamous Epithelial / HPF 0-5 0 - 5 /HPF   Hyaline Casts, UA PRESENT     Comment: Performed at [facility], [address] [place], [place] [address]  Rapid urine drug screen (hospital performed)     Status: Abnormal   Collection Time: 06/27/23  1:25 AM  Result Value Ref Range   Opiates POSITIVE (A) NONE DETECTED   Cocaine NONE DETECTED NONE DETECTED  Benzodiazepines NONE DETECTED NONE DETECTED   Amphetamines NONE DETECTED NONE DETECTED   Tetrahydrocannabinol POSITIVE (A) NONE DETECTED   Barbiturates NONE DETECTED NONE DETECTED    Comment: (NOTE) DRUG SCREEN FOR MEDICAL PURPOSES ONLY.  IF CONFIRMATION IS NEEDED FOR ANY PURPOSE, NOTIFY LAB WITHIN 5 DAYS.  LOWEST DETECTABLE LIMITS FOR URINE DRUG SCREEN Drug Class                     Cutoff (ng/mL) Amphetamine and metabolites    1000 Barbiturate and metabolites    200 Benzodiazepine                 200 Opiates and metabolites        300 Cocaine and metabolites        300 THC                            50 Performed at [facility], [address], [place], [place] [address]   CBC with Differential/Platelet     Status: None   Collection Time: 06/27/23  4:29 AM  Result Value Ref Range   WBC 6.1 4.0 - 10.5 K/uL   RBC 4.60 3.87 - 5.11 MIL/uL   Hemoglobin 13.4 12.0 - 15.0 g/dL   HCT 19.1 47.8 - 29.5 %   MCV 90.7 80.0 - 100.0 fL   MCH 29.1 26.0 - 34.0 pg   MCHC 32.1 30.0 - 36.0 g/dL   RDW 62.1 30.8 - 65.7 %   Platelets 181 150 - 400 K/uL   nRBC 0.0 0.0 - 0.2 %   Neutrophils Relative % 60 %   Neutro Abs 3.6 1.7 - 7.7 K/uL   Lymphocytes Relative 27 %   Lymphs Abs 1.6 0.7 - 4.0 K/uL   Monocytes Relative 13 %   Monocytes Absolute 0.8 0.1 - 1.0 K/uL   Eosinophils Relative 0 %   Eosinophils Absolute 0.0 0.0 - 0.5 K/uL   Basophils Relative 0 %   Basophils Absolute 0.0 0.0 - 0.1 K/uL   Immature Granulocytes 0 %   Abs Immature Granulocytes 0.02 0.00 - 0.07 K/uL    Comment: Performed at [facility], [address], [place], [place] [address]  Comprehensive metabolic panel     Status: Abnormal   Collection Time: 06/27/23  4:29 AM  Result Value Ref Range   Sodium 133 (L) 135 - 145 mmol/L   Potassium 3.8 3.5 - 5.1 mmol/L   Chloride 98 98 - 111 mmol/L   CO2 29 22 - 32 mmol/L   Glucose, Bld 110 (H) 70 - 99 mg/dL    Comment: Glucose reference  range applies only to samples taken after fasting for at least 8 hours.   BUN 55 (H) 8 - 23 mg/dL   Creatinine, Ser 2.95 (H) 0.44 - 1.00 mg/dL   Calcium 8.4 (L) 8.9 - 10.3 mg/dL   Total Protein 6.3 (L) 6.5 - 8.1 g/dL   Albumin 3.2 (L) 3.5 - 5.0 g/dL   AST 23 15 - 41 U/L   ALT 18 0 - 44 U/L   Alkaline Phosphatase 62 38 - 126 U/L   Total Bilirubin 0.7 0.3 - 1.2 mg/dL   GFR, Estimated 42 (L) >60 mL/min    Comment: (NOTE) Calculated using the CKD-EPI Creatinine Equation (2021)    Anion gap 6 5 - 15    Comment: Performed at [facility], [address] [place], [place] [address]  Magnesium     Status: Abnormal   Collection Time: 06/27/23  4:29 AM  Result Value Ref Range   Magnesium 2.6 (H) 1.7 - 2.4 mg/dL    Comment: Performed at [facility], [address] [place], [place] [address]   DG Chest Portable 1 View  Result Date: 06/27/2023 CLINICAL DATA:  67 year old female NG tube placement. Small-bowel obstruction by CT. EXAM: PORTABLE CHEST 1 VIEW COMPARISON:  Chest CT 06/16/2023. CT Abdomen and Pelvis yesterday. FINDINGS: Portable AP semi upright view at 0437 hours. Enteric tube placed into the stomach, side hole at the level of the gastric fundus. Platelike left lung base atelectasis. No other confluent lung opacity. Stable cardiac size and mediastinal contours. Numerous gas-filled bowel loops in the visible abdomen not significantly changed from the CT Abdomen and Pelvis yesterday. IMPRESSION: Satisfactory enteric tube placement into the stomach. Electronically Signed   By: Odessa Fleming M.D.   On: 06/27/2023 04:54   CT ABDOMEN PELVIS W CONTRAST  Result Date: 06/27/2023 CLINICAL DATA:  Bowel obstruction, abdominal pain EXAM: CT ABDOMEN AND PELVIS WITH CONTRAST TECHNIQUE: Multidetector CT imaging of the abdomen and pelvis was performed using the standard protocol following bolus administration of intravenous contrast. RADIATION DOSE REDUCTION: This exam was  performed according to the departmental dose-optimization program which includes automated exposure control, adjustment of the mA and/or kV according to patient size and/or use of iterative reconstruction technique. CONTRAST:  75mL OMNIPAQUE IOHEXOL 300 MG/ML  SOLN COMPARISON:  06/16/2023 FINDINGS: Lower chest: Elevation of the left hemidiaphragm again noted with mild left basilar atelectasis. Multiple nodular densities are again seen within the visualized right lung base, similar prior examination, but indeterminate. Cardiac size within normal limits. Hepatobiliary: No focal liver abnormality is seen. No gallstones, gallbladder wall thickening, or biliary dilatation. Pancreas: Unremarkable Spleen: Unremarkable Adrenals/Urinary Tract: The adrenal glands are unremarkable. The kidneys are normal. The bladder is unremarkable. Stomach/Bowel: The small bowel is diffusely dilated and fluid-filled with transition to a completely decompressed terminal ileum within the right upper quadrant in keeping with a distal small bowel obstruction. The exact point of transition is not clearly identified, but suspected at axial image # 20/2 and sagittal image # 36/10 within the right upper quadrant anterior to the right hepatic lobe. The terminal ileum and colon are decompressed. The appendix is unremarkable. No free intraperitoneal gas or fluid. Vascular/Lymphatic: Fusiform infrarenal abdominal aortic aneurysm is present with the abdominal aorta measuring 3.2 cm in greatest dimension on coronal image # 71/8. Moderate circumferential mural thrombus within the aneurysm sac. No pathologic adenopathy within the abdomen and pelvis. Reproductive: Uterus and bilateral adnexa are unremarkable. Other: No abdominal wall hernia or abnormality. No abdominopelvic ascites. Musculoskeletal: No acute bone abnormality. No lytic or blastic bone lesion. IMPRESSION: 1. Distal small bowel obstruction with transition point within the right upper quadrant  anterior to the right hepatic lobe. 2. 3.2 cm infrarenal abdominal aortic aneurysm. Recommend follow-up ultrasound every 3 years. 3. Multiple right basilar indeterminate pulmonary nodules measuring up to 14 mm better assessed on prior CT examination. As noted on that exam, short-term follow-up imaging in 3 months is recommended to assess for stability or resolution. Aortic Atherosclerosis (ICD10-I70.0). Electronically Signed   By: Helyn Numbers M.D.   On: 06/27/2023 00:25    Pending Labs Unresulted Labs (From admission, onward)    None       Vitals/Pain Today's Vitals   06/27/23 0044 06/27/23 0209 06/27/23 0600 06/27/23 0636  BP:  (!) 149/81 (!) 151/88   Pulse:  62 70   Resp:  18 16   Temp:  (!) 97.5 F (36.4 C)  98.2 F (36.8 C)  TempSrc:  Oral  Oral  SpO2:  97% 99%   Weight:      Height:      PainSc: 4        Isolation Precautions No active isolations  Medications Medications  acetaminophen (TYLENOL) tablet 650 mg (has no administration in time range)    Or  acetaminophen (TYLENOL) suppository 650 mg (has no administration in time range)  ondansetron (ZOFRAN) injection 4 mg (has no administration in time range)  naloxone [facility]) injection 0.4 mg (has no administration in time range)  HYDROmorphone (DILAUDID) injection 0.5 mg (0.5 mg Intravenous Given 06/27/23 0623)  lactated ringers infusion ( Intravenous New Bag/Given 06/27/23 0337)  LORazepam (ATIVAN) injection 1 mg (1 mg Intravenous Given 06/27/23 0617)  lactated ringers bolus 1,000 mL (1,000 mLs Intravenous Bolus 06/26/23 2309)  morphine (PF) 4 MG/ML injection 4 mg (4 mg Intravenous Given 06/26/23 2306)  ondansetron (ZOFRAN) injection 4 mg (4 mg Intravenous Given 06/26/23 2305)  iohexol (OMNIPAQUE) 300 MG/ML solution 75 mL (75 mLs Intravenous Contrast Given 06/26/23 2344)  lactated ringers bolus 1,000 mL (1,000 mLs Intravenous Bolus 06/27/23 0125)    Mobility walks with person assist      R Recommendations: See  Admitting Provider Note  Report given to: Arna Medici

## 2023-06-27 NOTE — ED Notes (Addendum)
a 

## 2023-06-27 NOTE — Consult Note (Signed)
Reason for Consult:  Small bowel obstruction Referring Physician: [facility]  Deanna Harrell is an 67 y.o. female.  HPI: This is a 67 year old female with no previous abdominal surgeries presents with constipation for about 10 days, unresponsive to stool softeners.  She has had some nausea and vomiting with abdominal distention.  She was seen in the ED 6/18 with similar symptoms with possible ileus on CT scan, but no other acute findings.  She presented again to the ED and her CT scan shows a small bowel obstruction in the distal SB.    Past Medical History:  Diagnosis Date   Hypertension    Substance abuse (HCC)    Tobacco abuse     No past surgical history on file.  Family History  Problem Relation Age of Onset   Diabetes Mother    Hypertension Mother    Stroke Mother    Hypertension Sister    Hypertension Brother    Colon cancer Neg Hx    Colon polyps Neg Hx    Stomach cancer Neg Hx    Rectal cancer Neg Hx    Esophageal cancer Neg Hx     Social History:  reports that she has been smoking cigarettes. She has a 11.75 pack-year smoking history. She has never used smokeless tobacco. She reports that she does not currently use alcohol. She reports current drug use. Frequency: 2.00 times per week. Drug: Marijuana.  Allergies: No Known Allergies  Medications:  Prior to Admission medications   Medication Sig Start Date End Date Taking? Authorizing Provider  amLODipine (NORVASC) 2.5 MG tablet Take 1 tablet (2.5 mg total) by mouth daily. 06/16/23  Yes Garlon Hatchet, PA-C  bisacodyl (DULCOLAX) 5 MG EC tablet Take 1 tablet (5 mg total) by mouth daily as needed for moderate constipation. 06/22/23  Yes Ellender Hose, NP     Results for orders placed or performed during the hospital encounter of 06/26/23 (from the past 48 hour(s))  CBC with Differential     Status: Abnormal   Collection Time: 06/26/23 10:56 PM  Result Value Ref Range   WBC 6.2 4.0 - 10.5 K/uL   RBC 5.59 (H)  3.87 - 5.11 MIL/uL   Hemoglobin 16.2 (H) 12.0 - 15.0 g/dL   HCT 78.2 (H) 95.6 - 21.3 %   MCV 88.6 80.0 - 100.0 fL   MCH 29.0 26.0 - 34.0 pg   MCHC 32.7 30.0 - 36.0 g/dL   RDW 08.6 57.8 - 46.9 %   Platelets 225 150 - 400 K/uL   nRBC 0.0 0.0 - 0.2 %   Neutrophils Relative % 76 %   Neutro Abs 4.7 1.7 - 7.7 K/uL   Lymphocytes Relative 15 %   Lymphs Abs 0.9 0.7 - 4.0 K/uL   Monocytes Relative 9 %   Monocytes Absolute 0.6 0.1 - 1.0 K/uL   Eosinophils Relative 0 %   Eosinophils Absolute 0.0 0.0 - 0.5 K/uL   Basophils Relative 0 %   Basophils Absolute 0.0 0.0 - 0.1 K/uL   Immature Granulocytes 0 %   Abs Immature Granulocytes 0.02 0.00 - 0.07 K/uL    Comment: Performed at [facility], [address] [place], [place] [address]  Comprehensive metabolic panel     Status: Abnormal   Collection Time: 06/26/23 10:56 PM  Result Value Ref Range   Sodium 134 (L) 135 - 145 mmol/L   Potassium 3.7 3.5 - 5.1 mmol/L   Chloride 91 (L) 98 -  111 mmol/L   CO2 28 22 - 32 mmol/L   Glucose, Bld 144 (H) 70 - 99 mg/dL    Comment: Glucose reference range applies only to samples taken after fasting for at least 8 hours.   BUN 70 (H) 8 - 23 mg/dL   Creatinine, Ser 4.09 (H) 0.44 - 1.00 mg/dL   Calcium 9.2 8.9 - 81.1 mg/dL   Total Protein 7.7 6.5 - 8.1 g/dL   Albumin 3.9 3.5 - 5.0 g/dL   AST 34 15 - 41 U/L   ALT 22 0 - 44 U/L   Alkaline Phosphatase 71 38 - 126 U/L   Total Bilirubin 0.7 0.3 - 1.2 mg/dL   GFR, Estimated 35 (L) >60 mL/min    Comment: (NOTE) Calculated using the CKD-EPI Creatinine Equation (2021)    Anion gap 15 5 - 15    Comment: Performed at [facility], [address] [place], [place] [address]  Lipase, blood     Status: None   Collection Time: 06/26/23 10:56 PM  Result Value Ref Range   Lipase 35 11 - 51 U/L    Comment: Performed at [facility], [address], [place], [place] [address]  I-stat chem 8, ED (not at [facility]  or [facility])     Status: Abnormal   Collection Time: 06/26/23 11:20 PM  Result Value Ref Range   Sodium 134 (L) 135 - 145 mmol/L   Potassium 3.8 3.5 - 5.1 mmol/L   Chloride 97 (L) 98 - 111 mmol/L   BUN 65 (H) 8 - 23 mg/dL   Creatinine, Ser 1.30 (H) 0.44 - 1.00 mg/dL   Glucose, Bld 865 (H) 70 - 99 mg/dL    Comment: Glucose reference range applies only to samples taken after fasting for at least 8 hours.   Calcium, Ion 1.00 (L) 1.15 - 1.40 mmol/L   TCO2 31 22 - 32 mmol/L   Hemoglobin 17.3 (H) 12.0 - 15.0 g/dL   HCT 78.4 (H) 69.6 - 29.5 %  Urinalysis, Routine w reflex microscopic -Urine, Clean Catch     Status: Abnormal   Collection Time: 06/27/23  1:25 AM  Result Value Ref Range   Color, Urine YELLOW YELLOW   APPearance HAZY (A) CLEAR   Specific Gravity, Urine 1.044 (H) 1.005 - 1.030   pH 5.0 5.0 - 8.0   Glucose, UA 50 (A) NEGATIVE mg/dL   Hgb urine dipstick SMALL (A) NEGATIVE   Bilirubin Urine NEGATIVE NEGATIVE   Ketones, ur 20 (A) NEGATIVE mg/dL   Protein, ur NEGATIVE NEGATIVE mg/dL   Nitrite NEGATIVE NEGATIVE   Leukocytes,Ua TRACE (A) NEGATIVE   RBC / HPF 0-5 0 - 5 RBC/hpf   WBC, UA 6-10 0 - 5 WBC/hpf   Bacteria, UA NONE SEEN NONE SEEN   Squamous Epithelial / HPF 0-5 0 - 5 /HPF   Hyaline Casts, UA PRESENT     Comment: Performed at [facility], [address] [place], [place] [address]  Rapid urine drug screen (hospital performed)     Status: Abnormal   Collection Time: 06/27/23  1:25 AM  Result Value Ref Range   Opiates POSITIVE (A) NONE DETECTED   Cocaine NONE DETECTED NONE DETECTED   Benzodiazepines NONE DETECTED NONE DETECTED   Amphetamines NONE DETECTED NONE DETECTED   Tetrahydrocannabinol POSITIVE (A) NONE DETECTED   Barbiturates NONE DETECTED NONE DETECTED    Comment: (NOTE) DRUG SCREEN FOR MEDICAL PURPOSES ONLY.  IF CONFIRMATION IS NEEDED  FOR ANY PURPOSE, NOTIFY LAB WITHIN 5 DAYS.  LOWEST DETECTABLE LIMITS FOR URINE DRUG SCREEN Drug Class                      Cutoff (ng/mL) Amphetamine and metabolites    1000 Barbiturate and metabolites    200 Benzodiazepine                 200 Opiates and metabolites        300 Cocaine and metabolites        300 THC                            50 Performed at [facility], [address] [place], [place] [address]   CBC with Differential/Platelet     Status: None   Collection Time: 06/27/23  4:29 AM  Result Value Ref Range   WBC 6.1 4.0 - 10.5 K/uL   RBC 4.60 3.87 - 5.11 MIL/uL   Hemoglobin 13.4 12.0 - 15.0 g/dL   HCT 60.4 54.0 - 98.1 %   MCV 90.7 80.0 - 100.0 fL   MCH 29.1 26.0 - 34.0 pg   MCHC 32.1 30.0 - 36.0 g/dL   RDW 19.1 47.8 - 29.5 %   Platelets 181 150 - 400 K/uL   nRBC 0.0 0.0 - 0.2 %   Neutrophils Relative % 60 %   Neutro Abs 3.6 1.7 - 7.7 K/uL   Lymphocytes Relative 27 %   Lymphs Abs 1.6 0.7 - 4.0 K/uL   Monocytes Relative 13 %   Monocytes Absolute 0.8 0.1 - 1.0 K/uL   Eosinophils Relative 0 %   Eosinophils Absolute 0.0 0.0 - 0.5 K/uL   Basophils Relative 0 %   Basophils Absolute 0.0 0.0 - 0.1 K/uL   Immature Granulocytes 0 %   Abs Immature Granulocytes 0.02 0.00 - 0.07 K/uL    Comment: Performed at [facility], [address], [place], [place] [address]  Comprehensive metabolic panel     Status: Abnormal   Collection Time: 06/27/23  4:29 AM  Result Value Ref Range   Sodium 133 (L) 135 - 145 mmol/L   Potassium 3.8 3.5 - 5.1 mmol/L   Chloride 98 98 - 111 mmol/L   CO2 29 22 - 32 mmol/L   Glucose, Bld 110 (H) 70 - 99 mg/dL    Comment: Glucose reference range applies only to samples taken after fasting for at least 8 hours.   BUN 55 (H) 8 - 23 mg/dL   Creatinine, Ser 8.65 (H) 0.44 - 1.00 mg/dL   Calcium 8.4 (L) 8.9 - 10.3 mg/dL   Total Protein 6.3 (L) 6.5 - 8.1 g/dL   Albumin 3.2 (L) 3.5 - 5.0 g/dL   AST 23 15 - 41 U/L   ALT 18 0 - 44 U/L   Alkaline Phosphatase 62 38 - 126 U/L   Total Bilirubin 0.7 0.3 - 1.2 mg/dL   GFR,  Estimated 42 (L) >60 mL/min    Comment: (NOTE) Calculated using the CKD-EPI Creatinine Equation (2021)    Anion gap 6 5 - 15    Comment: Performed at [facility], [address] [place], [place] [address]  Magnesium     Status: Abnormal   Collection Time: 06/27/23  4:29 AM  Result Value Ref Range   Magnesium 2.6 (H) 1.7 - 2.4 mg/dL    Comment: Performed at [facility], [address], [place], [place] [address]    DG Chest Portable 1 View  Result Date: 06/27/2023 CLINICAL DATA:  67 year old female NG tube placement. Small-bowel obstruction by CT. EXAM: PORTABLE CHEST 1 VIEW COMPARISON:  Chest CT 06/16/2023. CT Abdomen and Pelvis yesterday. FINDINGS: Portable AP semi upright view at 0437 hours. Enteric tube placed into the stomach, side hole at the level of the gastric fundus. Platelike left lung base atelectasis. No other confluent lung opacity. Stable cardiac size and mediastinal contours. Numerous gas-filled bowel loops in the visible abdomen not significantly changed from the CT Abdomen and Pelvis yesterday. IMPRESSION: Satisfactory enteric tube placement into the stomach. Electronically Signed   By: Odessa Fleming M.D.   On: 06/27/2023 04:54   CT ABDOMEN PELVIS W CONTRAST  Result Date: 06/27/2023 CLINICAL DATA:  Bowel obstruction, abdominal pain EXAM: CT ABDOMEN AND PELVIS WITH CONTRAST TECHNIQUE: Multidetector CT imaging of the abdomen and pelvis was performed using the standard protocol following bolus administration of intravenous contrast. RADIATION DOSE REDUCTION: This exam was performed according to the departmental dose-optimization program which includes automated exposure control, adjustment of the mA and/or kV according to patient size and/or use of iterative reconstruction technique. CONTRAST:  75mL OMNIPAQUE IOHEXOL 300 MG/ML  SOLN COMPARISON:  06/16/2023 FINDINGS: Lower chest: Elevation of the left hemidiaphragm again noted with mild left basilar  atelectasis. Multiple nodular densities are again seen within the visualized right lung base, similar prior examination, but indeterminate. Cardiac size within normal limits. Hepatobiliary: No focal liver abnormality is seen. No gallstones, gallbladder wall thickening, or biliary dilatation. Pancreas: Unremarkable Spleen: Unremarkable Adrenals/Urinary Tract: The adrenal glands are unremarkable. The kidneys are normal. The bladder is unremarkable. Stomach/Bowel: The small bowel is diffusely dilated and fluid-filled with transition to a completely decompressed terminal ileum within the right upper quadrant in keeping with a distal small bowel obstruction. The exact point of transition is not clearly identified, but suspected at axial image # 20/2 and sagittal image # 36/10 within the right upper quadrant anterior to the right hepatic lobe. The terminal ileum and colon are decompressed. The appendix is unremarkable. No free intraperitoneal gas or fluid. Vascular/Lymphatic: Fusiform infrarenal abdominal aortic aneurysm is present with the abdominal aorta measuring 3.2 cm in greatest dimension on coronal image # 71/8. Moderate circumferential mural thrombus within the aneurysm sac. No pathologic adenopathy within the abdomen and pelvis. Reproductive: Uterus and bilateral adnexa are unremarkable. Other: No abdominal wall hernia or abnormality. No abdominopelvic ascites. Musculoskeletal: No acute bone abnormality. No lytic or blastic bone lesion. IMPRESSION: 1. Distal small bowel obstruction with transition point within the right upper quadrant anterior to the right hepatic lobe. 2. 3.2 cm infrarenal abdominal aortic aneurysm. Recommend follow-up ultrasound every 3 years. 3. Multiple right basilar indeterminate pulmonary nodules measuring up to 14 mm better assessed on prior CT examination. As noted on that exam, short-term follow-up imaging in 3 months is recommended to assess for stability or resolution. Aortic  Atherosclerosis (ICD10-I70.0). Electronically Signed   By: Helyn Numbers M.D.   On: 06/27/2023 00:25    Review of Systems  Constitutional:  Positive for appetite change.  HENT:  Negative for ear discharge, ear pain, hearing loss and tinnitus.   Eyes:  Negative for photophobia and pain.  Respiratory:  Negative for cough and shortness of breath.   Cardiovascular:  Negative for chest pain.  Gastrointestinal:  Positive for abdominal distention, abdominal pain, constipation, nausea and vomiting.  Genitourinary:  Negative for dysuria, flank  pain, frequency and urgency.  Musculoskeletal:  Negative for back pain, myalgias and neck pain.  Neurological:  Negative for dizziness and headaches.  Hematological:  Does not bruise/bleed easily.  Psychiatric/Behavioral:  The patient is not nervous/anxious.    Blood pressure (!) 151/88, pulse 70, temperature 98.2 F (36.8 C), temperature source Oral, resp. rate 16, height 5\' 7"  (1.702 m), weight 49.9 kg, SpO2 99 %. Physical Exam Constitutional:  WDWN in NAD, sleepy, no obvious deformities; lying in bed comfortably Eyes:  Pupils equal, round; sclera anicteric; moist conjunctiva; no lid lag HENT:  Oral mucosa moist; good dentition  Neck:  No masses palpated, trachea midline; no thyromegaly Lungs:  CTA bilaterally; normal respiratory effort CV:  Regular rate and rhythm; no murmurs; extremities well-perfused with no edema Abd:  distended; small soft umbilical hernia; non-tender Musc:  Unable to assess gait; no apparent clubbing or cyanosis in extremities Lymphatic:  No palpable cervical or axillary lymphadenopathy Skin:  Warm, dry; no sign of jaundice Psychiatric - alert and oriented x 4; calm mood and affect  Assessment/Plan: Small obstruction with apparent transition zone in distal small bowel in RUQ No previous abdominal surgeries  Recommendations:  NPO x ice chips NG tube to decompress small bowel SBO protocol with gastrografin If no improvement  with 1-2 days, may need exploration.  Wilmon Arms Jeanette Moffatt 06/27/2023, 7:24 AM

## 2023-06-28 ENCOUNTER — Encounter (HOSPITAL_COMMUNITY): Admission: EM | Disposition: A | Discharge status: Home / Self Care | Payer: Self-pay | Source: Home / Self Care

## 2023-06-28 ENCOUNTER — Other Ambulatory Visit: Payer: Self-pay

## 2023-06-28 ENCOUNTER — Inpatient Hospital Stay (HOSPITAL_COMMUNITY): Payer: 59 | Admitting: Anesthesiology

## 2023-06-28 DIAGNOSIS — K565 Intestinal adhesions [bands], unspecified as to partial versus complete obstruction: Secondary | ICD-10-CM

## 2023-06-28 DIAGNOSIS — K56609 Unspecified intestinal obstruction, unspecified as to partial versus complete obstruction: Secondary | ICD-10-CM | POA: Diagnosis not present

## 2023-06-28 HISTORY — PX: LAPAROTOMY: SHX154

## 2023-06-28 LAB — COMPREHENSIVE METABOLIC PANEL
ALT: 17 U/L (ref 0–44)
AST: 20 U/L (ref 15–41)
Albumin: 3.1 g/dL — ABNORMAL LOW (ref 3.5–5.0)
Alkaline Phosphatase: 51 U/L (ref 38–126)
Anion gap: 14 (ref 5–15)
BUN: 33 mg/dL — ABNORMAL HIGH (ref 8–23)
CO2: 27 mmol/L (ref 22–32)
Calcium: 8.7 mg/dL — ABNORMAL LOW (ref 8.9–10.3)
Chloride: 98 mmol/L (ref 98–111)
Creatinine, Ser: 1.2 mg/dL — ABNORMAL HIGH (ref 0.44–1.00)
GFR, Estimated: 50 mL/min — ABNORMAL LOW (ref >60)
Glucose, Bld: 85 mg/dL (ref 70–99)
Potassium: 3.7 mmol/L (ref 3.5–5.1)
Sodium: 139 mmol/L (ref 135–145)
Total Bilirubin: 1 mg/dL (ref 0.3–1.2)
Total Protein: 6.1 g/dL — ABNORMAL LOW (ref 6.5–8.1)

## 2023-06-28 LAB — CBC WITH DIFFERENTIAL/PLATELET
Abs Immature Granulocytes: 0.02 10*3/uL (ref 0.00–0.07)
Basophils Absolute: 0 10*3/uL (ref 0.0–0.1)
Basophils Relative: 0 %
Eosinophils Absolute: 0 10*3/uL (ref 0.0–0.5)
Eosinophils Relative: 0 %
HCT: 43 % (ref 36.0–46.0)
Hemoglobin: 13.5 g/dL (ref 12.0–15.0)
Immature Granulocytes: 0 %
Lymphocytes Relative: 25 %
Lymphs Abs: 1.6 10*3/uL (ref 0.7–4.0)
MCH: 29.2 pg (ref 26.0–34.0)
MCHC: 31.4 g/dL (ref 30.0–36.0)
MCV: 92.9 fL (ref 80.0–100.0)
Monocytes Absolute: 0.7 10*3/uL (ref 0.1–1.0)
Monocytes Relative: 11 %
Neutro Abs: 4.1 10*3/uL (ref 1.7–7.7)
Neutrophils Relative %: 64 %
Platelets: 193 10*3/uL (ref 150–400)
RBC: 4.63 MIL/uL (ref 3.87–5.11)
RDW: 13.5 % (ref 11.5–15.5)
WBC: 6.4 10*3/uL (ref 4.0–10.5)
nRBC: 0 % (ref 0.0–0.2)

## 2023-06-28 LAB — PREALBUMIN: Prealbumin: 14 mg/dL — ABNORMAL LOW (ref 18–38)

## 2023-06-28 LAB — MAGNESIUM: Magnesium: 2.5 mg/dL — ABNORMAL HIGH (ref 1.7–2.4)

## 2023-06-28 LAB — PHOSPHORUS: Phosphorus: 3.1 mg/dL (ref 2.5–4.6)

## 2023-06-28 SURGERY — LAPAROTOMY, EXPLORATORY
Anesthesia: General

## 2023-06-28 MED ORDER — CEFAZOLIN SODIUM-DEXTROSE 2-4 GM/100ML-% IV SOLN
2.0000 g | INTRAVENOUS | Status: AC
  Administered 2023-06-28: 2 g via INTRAVENOUS

## 2023-06-28 MED ORDER — SUGAMMADEX SODIUM 200 MG/2ML IV SOLN
INTRAVENOUS | Status: DC | PRN
  Administered 2023-06-28: 100 mg via INTRAVENOUS

## 2023-06-28 MED ORDER — ONDANSETRON HCL 4 MG/2ML IJ SOLN: 4.0000 mg | Freq: Once | INTRAMUSCULAR | Status: DC | PRN

## 2023-06-28 MED ORDER — FENTANYL CITRATE (PF) 100 MCG/2ML IJ SOLN
INTRAMUSCULAR | Status: AC
  Filled 2023-06-28: qty 2

## 2023-06-28 MED ORDER — HYDROMORPHONE HCL 1 MG/ML IJ SOLN
0.5000 mg | INTRAMUSCULAR | Status: DC | PRN
  Administered 2023-06-28 – 2023-06-30 (×3): 0.5 mg via INTRAVENOUS
  Administered 2023-06-30: 1 mg via INTRAVENOUS
  Administered 2023-06-30: 0.5 mg via INTRAVENOUS
  Administered 2023-06-30 – 2023-07-04 (×5): 1 mg via INTRAVENOUS
  Filled 2023-06-28 (×11): qty 1

## 2023-06-28 MED ORDER — PROPOFOL 10 MG/ML IV BOLUS
INTRAVENOUS | Status: AC
  Filled 2023-06-28: qty 20

## 2023-06-28 MED ORDER — PROPOFOL 10 MG/ML IV BOLUS
INTRAVENOUS | Status: DC | PRN
  Administered 2023-06-28: 100 mg via INTRAVENOUS

## 2023-06-28 MED ORDER — MIDAZOLAM HCL 5 MG/5ML IJ SOLN
INTRAMUSCULAR | Status: DC | PRN
  Administered 2023-06-28: 2 mg via INTRAVENOUS

## 2023-06-28 MED ORDER — AMISULPRIDE (ANTIEMETIC) 5 MG/2ML IV SOLN: 10.0000 mg | Freq: Once | INTRAVENOUS | Status: DC | PRN

## 2023-06-28 MED ORDER — FENTANYL CITRATE (PF) 100 MCG/2ML IJ SOLN
INTRAMUSCULAR | Status: DC | PRN
  Administered 2023-06-28: 50 ug via INTRAVENOUS
  Administered 2023-06-28: 100 ug via INTRAVENOUS
  Administered 2023-06-28: 50 ug via INTRAVENOUS

## 2023-06-28 MED ORDER — ACETAMINOPHEN 10 MG/ML IV SOLN: 750.0000 mg | Freq: Once | INTRAVENOUS | Status: DC | PRN

## 2023-06-28 MED ORDER — ONDANSETRON HCL 4 MG/2ML IJ SOLN
INTRAMUSCULAR | Status: AC
  Filled 2023-06-28: qty 2

## 2023-06-28 MED ORDER — LACTATED RINGERS IV SOLN: INTRAVENOUS | Status: DC | PRN

## 2023-06-28 MED ORDER — ROCURONIUM BROMIDE 100 MG/10ML IV SOLN
INTRAVENOUS | Status: DC | PRN
  Administered 2023-06-28: 40 mg via INTRAVENOUS
  Administered 2023-06-28: 20 mg via INTRAVENOUS

## 2023-06-28 MED ORDER — ACETAMINOPHEN 10 MG/ML IV SOLN
1000.0000 mg | Freq: Four times a day (QID) | INTRAVENOUS | Status: AC
  Administered 2023-06-28 – 2023-06-29 (×4): 1000 mg via INTRAVENOUS
  Filled 2023-06-28 (×4): qty 100

## 2023-06-28 MED ORDER — ONDANSETRON HCL 4 MG/2ML IJ SOLN
INTRAMUSCULAR | Status: DC | PRN
  Administered 2023-06-28: 4 mg via INTRAVENOUS

## 2023-06-28 MED ORDER — SODIUM CHLORIDE (PF) 0.9 % IJ SOLN
INTRAMUSCULAR | Status: AC
  Filled 2023-06-28: qty 10

## 2023-06-28 MED ORDER — DEXAMETHASONE SODIUM PHOSPHATE 4 MG/ML IJ SOLN
INTRAMUSCULAR | Status: DC | PRN
  Administered 2023-06-28: 5 mg via INTRAVENOUS

## 2023-06-28 MED ORDER — BUPIVACAINE HCL (PF) 0.25 % IJ SOLN
INTRAMUSCULAR | Status: AC
  Filled 2023-06-28: qty 30

## 2023-06-28 MED ORDER — LIDOCAINE HCL (PF) 2 % IJ SOLN
INTRAMUSCULAR | Status: AC
  Filled 2023-06-28: qty 5

## 2023-06-28 MED ORDER — LIDOCAINE HCL (CARDIAC) PF 100 MG/5ML IV SOSY
PREFILLED_SYRINGE | INTRAVENOUS | Status: DC | PRN
  Administered 2023-06-28: 60 mg via INTRATRACHEAL

## 2023-06-28 MED ORDER — PHENYLEPHRINE HCL (PRESSORS) 10 MG/ML IV SOLN
INTRAVENOUS | Status: DC | PRN
  Administered 2023-06-28: 160 ug via INTRAVENOUS
  Administered 2023-06-28 (×3): 80 ug via INTRAVENOUS

## 2023-06-28 MED ORDER — SUCCINYLCHOLINE CHLORIDE 200 MG/10ML IV SOSY
PREFILLED_SYRINGE | INTRAVENOUS | Status: AC
  Filled 2023-06-28: qty 10

## 2023-06-28 MED ORDER — FENTANYL CITRATE PF 50 MCG/ML IJ SOSY: 25.0000 ug | PREFILLED_SYRINGE | INTRAMUSCULAR | Status: DC | PRN

## 2023-06-28 MED ORDER — MIDAZOLAM HCL 2 MG/2ML IJ SOLN
INTRAMUSCULAR | Status: AC
  Filled 2023-06-28: qty 2

## 2023-06-28 MED ORDER — BUPIVACAINE LIPOSOME 1.3 % IJ SUSP
INTRAMUSCULAR | Status: AC
  Filled 2023-06-28: qty 20

## 2023-06-28 MED ORDER — SUCCINYLCHOLINE CHLORIDE 200 MG/10ML IV SOSY
PREFILLED_SYRINGE | INTRAVENOUS | Status: DC | PRN
  Administered 2023-06-28: 60 mg via INTRAVENOUS

## 2023-06-28 MED ORDER — BUPIVACAINE LIPOSOME 1.3 % IJ SUSP
INTRAMUSCULAR | Status: DC | PRN
  Administered 2023-06-28: 20 mL

## 2023-06-28 MED ORDER — 0.9 % SODIUM CHLORIDE (POUR BTL) OPTIME
TOPICAL | Status: DC | PRN
  Administered 2023-06-28: 1000 mL

## 2023-06-28 MED ORDER — LACTATED RINGERS IV SOLN: INTRAVENOUS | Status: DC

## 2023-06-28 MED ORDER — METHOCARBAMOL 1000 MG/10ML IJ SOLN
1000.0000 mg | Freq: Three times a day (TID) | INTRAVENOUS | Status: DC
  Administered 2023-06-28 – 2023-07-02 (×12): 1000 mg via INTRAVENOUS
  Filled 2023-06-28 (×3): qty 10
  Filled 2023-06-28: qty 1000
  Filled 2023-06-28 (×6): qty 10
  Filled 2023-06-28: qty 1000
  Filled 2023-06-28 (×4): qty 10
  Filled 2023-06-28: qty 1000
  Filled 2023-06-28 (×3): qty 10

## 2023-06-28 SURGICAL SUPPLY — 38 items
APL SWBSTK 6 STRL LF DISP (MISCELLANEOUS) ×1
APPLICATOR COTTON TIP 6 STRL (MISCELLANEOUS) ×1 IMPLANT
APPLICATOR COTTON TIP 6IN STRL (MISCELLANEOUS) ×1 IMPLANT
BAG COUNTER SPONGE SURGICOUNT (BAG) IMPLANT
BAG SPNG CNTER NS LX DISP (BAG)
BLADE EXTENDED COATED 6.5IN (ELECTRODE) IMPLANT
BLADE HEX COATED 2.75 (ELECTRODE) ×1 IMPLANT
COVER MAYO STAND STRL (DRAPES) IMPLANT
COVER SURGICAL LIGHT HANDLE (MISCELLANEOUS) ×1 IMPLANT
DRAPE LAPAROSCOPIC ABDOMINAL (DRAPES) ×1 IMPLANT
DRAPE UTILITY XL STRL (DRAPES) ×1 IMPLANT
DRAPE WARM FLUID 44X44 (DRAPES) IMPLANT
DRSG OPSITE POSTOP 4X10 (GAUZE/BANDAGES/DRESSINGS) IMPLANT
ELECT REM PT RETURN 15FT ADLT (MISCELLANEOUS) ×1 IMPLANT
GAUZE SPONGE 4X4 12PLY STRL (GAUZE/BANDAGES/DRESSINGS) ×1 IMPLANT
GLOVE BIO SURGEON STRL SZ7 (GLOVE) ×1 IMPLANT
GLOVE BIOGEL PI IND STRL 7.0 (GLOVE) ×1 IMPLANT
GLOVE BIOGEL PI IND STRL 7.5 (GLOVE) ×1 IMPLANT
GOWN STRL REUS W/ TWL LRG LVL3 (GOWN DISPOSABLE) ×2 IMPLANT
GOWN STRL REUS W/TWL LRG LVL3 (GOWN DISPOSABLE) ×2
HANDLE SUCTION POOLE (INSTRUMENTS) IMPLANT
KIT BASIN OR (CUSTOM PROCEDURE TRAY) ×1 IMPLANT
KIT TURNOVER KIT A (KITS) IMPLANT
NS IRRIG 1000ML POUR BTL (IV SOLUTION) IMPLANT
PACK GENERAL/GYN (CUSTOM PROCEDURE TRAY) ×1 IMPLANT
STAPLER VISISTAT 35W (STAPLE) ×1 IMPLANT
SUCTION POOLE HANDLE (INSTRUMENTS)
SUT PDS AB 1 TP1 96 (SUTURE) IMPLANT
SUT SILK 2 0 (SUTURE) ×1
SUT SILK 2 0 SH CR/8 (SUTURE) IMPLANT
SUT SILK 2-0 18XBRD TIE 12 (SUTURE) IMPLANT
SUT SILK 3 0 (SUTURE) ×1
SUT SILK 3 0 SH CR/8 (SUTURE) IMPLANT
SUT SILK 3-0 18XBRD TIE 12 (SUTURE) IMPLANT
TOWEL OR 17X26 10 PK STRL BLUE (TOWEL DISPOSABLE) ×2 IMPLANT
TRAY FOLEY MTR SLVR 16FR STAT (SET/KITS/TRAYS/PACK) ×1 IMPLANT
WATER STERILE IRR 1000ML POUR (IV SOLUTION) IMPLANT
YANKAUER SUCT BULB TIP NO VENT (SUCTIONS) IMPLANT

## 2023-06-28 NOTE — Progress Notes (Signed)
PROGRESS NOTE    Deanna Harrell  VOZ:[id number] DOB: 1956-11-09 DOA: 06/26/2023 PCP: Ellender Hose, NP  Chief Complaint  Patient presents with   Abdominal Pain   Constipation    Brief Narrative:   Deanna Harrell is Deanna Harrell 67 y.o. female with medical history significant of hypertension, substance abuse who presented with [facility] small bowel obstruction.  Assessment & Plan:   Principal Problem:   SBO (small bowel obstruction) (HCC)  Small bowel obstruction Appreciate surgical team assistance S/p Exploratory laparotomy, lysis of adhesions (7/1) Appreciate surgery recommendations   Acute kidney injury secondary to volume depletion Improving gradually   Substance abuse Tobacco Abuse UDS positive for opiates and THC Known history of prior cocaine use Continues to use tobacco products   Pulmonary nodules Seen on CT scan with radiologist recommending short-term follow-up in 3 months to determine stability    DVT prophylaxis: SCD Code Status: full Family Communication: none Disposition:   Status is: Inpatient Remains inpatient appropriate because: need for surgery, continued inpatient care   Consultants:  surgery  Procedures:  Exploratory laparotomy, lysis of adhesions   Antimicrobials:  Anti-infectives (From admission, onward)    Start     Dose/Rate Route Frequency Ordered Stop   06/28/23 0915  ceFAZolin (ANCEF) IVPB 2g/100 mL premix        2 g 200 mL/hr over 30 Minutes Intravenous On call to O.R. 06/28/23 0911 06/28/23 1215       Subjective: Feels poorly  Objective: Vitals:   06/28/23 1400 06/28/23 1415 06/28/23 1439 06/28/23 1532  BP: (!) 165/92 (!) 162/87 (!) 158/85 (!) 141/84  Pulse: 71 67 67 91  Resp: 17 14 18    Temp:  (!) 97.5 F (36.4 C) (!) 97.4 F (36.3 C)   TempSrc:   Axillary   SpO2: 99% 96% 96% 97%  Weight:      Height:        Intake/Output Summary (Last 24 hours) at 06/28/2023 1618 Last data filed at 06/28/2023 1528 Gross per 24 hour  Intake  3447.08 ml  Output 3625 ml  Net -177.92 ml   Filed Weights   06/26/23 2141 06/28/23 1001  Weight: 49.9 kg 49.9 kg    Examination:  General: No acute distress. Cardiovascular: RRR Lungs: unlabored Abd: s/distended, mild tenderness Neurological: Alert and oriented 3. Moves all extremities 4. Cranial nerves II through XII grossly intact. Skin: Warm and dry. No rashes or lesions. Extremities: No clubbing or cyanosis. No edema.   Data Reviewed: I have personally reviewed following labs and imaging studies  CBC: Recent Labs  Lab 06/26/23 2256 06/26/23 2320 06/27/23 0429 06/28/23 0413  WBC 6.2  --  6.1 6.4  NEUTROABS 4.7  --  3.6 4.1  HGB 16.2* 17.3* 13.4 13.5  HCT 49.5* 51.0* 41.7 43.0  MCV 88.6  --  90.7 92.9  PLT 225  --  181 193    Basic Metabolic Panel: Recent Labs  Lab 06/26/23 2256 06/26/23 2320 06/27/23 0429 06/28/23 0413  NA 134* 134* 133* 139  K 3.7 3.8 3.8 3.7  CL 91* 97* 98 98  CO2 28  --  29 27  GLUCOSE 144* 143* 110* 85  BUN 70* 65* 55* 33*  CREATININE 1.62* 1.70* 1.37* 1.20*  CALCIUM 9.2  --  8.4* 8.7*  MG  --   --  2.6* 2.5*  PHOS  --   --   --  3.1    GFR: Estimated Creatinine Clearance: 35.8 mL/min (Deanna Harrell) (by C-G formula based on  SCr of 1.2 mg/dL (H)).  Liver Function Tests: Recent Labs  Lab 06/26/23 2256 06/27/23 0429 06/28/23 0413  AST 34 23 20  ALT 22 18 17   ALKPHOS 71 62 51  BILITOT 0.7 0.7 1.0  PROT 7.7 6.3* 6.1*  ALBUMIN 3.9 3.2* 3.1*    CBG: No results for input(s): "GLUCAP" in the last 168 hours.   No results found for this or any previous visit (from the past 240 hour(s)).       Radiology Studies: [place] EKG SITE RITE  Result Date: 06/28/2023 If Site Rite image not attached, placement could not be confirmed due to current cardiac rhythm.  DG Abd Portable 1V-Small Bowel Obstruction Protocol-initial, 8 hr delay  Result Date: 06/27/2023 CLINICAL DATA:  SBO protocol, 8 hour delayed image. EXAM: PORTABLE ABDOMEN - 1  VIEW COMPARISON:  CT abdomen pelvis 06/26/2023 FINDINGS: Numerous gas-filled dilated small bowel loops throughout the abdomen, unchanged compared to 06/26/2023. Enteric tube tip and side hole project over the expected location of the upper gastric body. Oral contrast is seen within the stomach and in the medial right upper abdomen, in the expected location of the proximal duodenum. Flocculation of the barium suspension is noted. IMPRESSION: Oral contrast is seen within the stomach and proximal duodenum. Persistent dilated gas-filled loops of small bowel throughout the abdomen, unchanged compared to 06/26/2023. Electronically Signed   By: Sherron Ales M.D.   On: 06/27/2023 19:28   DG Chest Portable 1 View  Result Date: 06/27/2023 CLINICAL DATA:  67 year old female NG tube placement. Small-bowel obstruction by CT. EXAM: PORTABLE CHEST 1 VIEW COMPARISON:  Chest CT 06/16/2023. CT Abdomen and Pelvis yesterday. FINDINGS: Portable AP semi upright view at 0437 hours. Enteric tube placed into the stomach, side hole at the level of the gastric fundus. Platelike left lung base atelectasis. No other confluent lung opacity. Stable cardiac size and mediastinal contours. Numerous gas-filled bowel loops in the visible abdomen not significantly changed from the CT Abdomen and Pelvis yesterday. IMPRESSION: Satisfactory enteric tube placement into the stomach. Electronically Signed   By: Odessa Fleming M.D.   On: 06/27/2023 04:54   CT ABDOMEN PELVIS W CONTRAST  Result Date: 06/27/2023 CLINICAL DATA:  Bowel obstruction, abdominal pain EXAM: CT ABDOMEN AND PELVIS WITH CONTRAST TECHNIQUE: Multidetector CT imaging of the abdomen and pelvis was performed using the standard protocol following bolus administration of intravenous contrast. RADIATION DOSE REDUCTION: This exam was performed according to the departmental dose-optimization program which includes automated exposure control, adjustment of the mA and/or kV according to patient size  and/or use of iterative reconstruction technique. CONTRAST:  75mL OMNIPAQUE IOHEXOL 300 MG/ML  SOLN COMPARISON:  06/16/2023 FINDINGS: Lower chest: Elevation of the left hemidiaphragm again noted with mild left basilar atelectasis. Multiple nodular densities are again seen within the visualized right lung base, similar prior examination, but indeterminate. Cardiac size within normal limits. Hepatobiliary: No focal liver abnormality is seen. No gallstones, gallbladder wall thickening, or biliary dilatation. Pancreas: Unremarkable Spleen: Unremarkable Adrenals/Urinary Tract: The adrenal glands are unremarkable. The kidneys are normal. The bladder is unremarkable. Stomach/Bowel: The small bowel is diffusely dilated and fluid-filled with transition to Deanna Harrell completely decompressed terminal ileum within the right upper quadrant in keeping with Deanna Harrell distal small bowel obstruction. The exact point of transition is not clearly identified, but suspected at axial image # 20/2 and sagittal image # 36/10 within the right upper quadrant anterior to the right hepatic lobe. The terminal ileum and colon are decompressed. The appendix is  unremarkable. No free intraperitoneal gas or fluid. Vascular/Lymphatic: Fusiform infrarenal abdominal aortic aneurysm is present with the abdominal aorta measuring 3.2 cm in greatest dimension on coronal image # 71/8. Moderate circumferential mural thrombus within the aneurysm sac. No pathologic adenopathy within the abdomen and pelvis. Reproductive: Uterus and bilateral adnexa are unremarkable. Other: No abdominal wall hernia or abnormality. No abdominopelvic ascites. Musculoskeletal: No acute bone abnormality. No lytic or blastic bone lesion. IMPRESSION: 1. Distal small bowel obstruction with transition point within the right upper quadrant anterior to the right hepatic lobe. 2. 3.2 cm infrarenal abdominal aortic aneurysm. Recommend follow-up ultrasound every 3 years. 3. Multiple right basilar  indeterminate pulmonary nodules measuring up to 14 mm better assessed on prior CT examination. As noted on that exam, short-term follow-up imaging in 3 months is recommended to assess for stability or resolution. Aortic Atherosclerosis (ICD10-I70.0). Electronically Signed   By: Helyn Numbers M.D.   On: 06/27/2023 00:25        Scheduled Meds:  pantoprazole (PROTONIX) IV  40 mg Intravenous Q24H   Continuous Infusions:  acetaminophen     lactated ringers 100 mL/hr at 06/28/23 1442   methocarbamol (ROBAXIN) IV 1,000 mg (06/28/23 1528)     LOS: 1 day    Time spent: over 30 min    Lacretia Nicks, MD [facility]   To contact the attending provider between 7A-7P or the covering provider during after hours 7P-7A, please log into the web site [email or web address] and access using universal  password for that web site. If you do not have the password, please call the hospital operator.  06/28/2023, 4:18 PM

## 2023-06-28 NOTE — Progress Notes (Signed)
PHARMACY - TOTAL PARENTERAL NUTRITION CONSULT NOTE   Indication: Prolonged ileus  Patient Measurements: Height: 5\' 7"  (170.2 cm) Weight: 49.9 kg (110 lb) IBW/kg (Calculated) : 61.6 TPN AdjBW (KG): 49.9 Body mass index is 17.23 kg/m. Usual Weight:    - 6/18 weight 52.2 kg  Assessment:  Pharmacy consulted to manage TPN for this 67 year old female who presented with several weeks of abdominal pain and distention, nausea, vomiting. CT revealed ileus on 6/18.  Discharged with improvement but then her symptoms continued and worsened. She returned on 6/29 to ED. CT scan showed small bowel obstruction. Conservative management attempted with a nasogastric tube with no improvement. Today, exploratory laparotomy, lysis of adhesions performed. Glucose / Insulin: No history of DM. Goal CBGs 100-150 Electrolytes: WNL except magnesium which is elevated at 2.5 Renal: SCr elevated at 1.2, BUN elevated at 33 Hepatic: Albumin low at 3.1, pre-albumin ordered AST & ALT WNL Intake / Output; MIVF: LR at 100 ml/hr GI Imaging: 6/30 DG Abd: Persistent dilated gas-filled loops of small bowel throughout the abdomen, unchanged compared to 06/26/2023. 6/30 DG Chest: Satisfactory enteric tube placement into the stomach.  6/29 CT Abdomen Pelvis:  Distal small bowel obstruction with transition point within the right upper quadrant anterior to the right hepatic lobe. GI Surgeries / Procedures:  7/1-exploratory laparotomy, lysis of adhesions Central access: PICC ordered TPN start date: 7/2  Nutritional Goals: To be determined Goal TPN rate is _____ mL/hr (provides _____ g of protein and _____ kcals per day)  RD Assessment: to be completed    Current Nutrition:  NPO  Plan:  TPN to begin 7/2 as it is past 12 noon cut-off time for TPN orders & compounding Continue LR at 100 ml/hour per provider (D10 orders before TPN initiated to be per provider discretion who has been notified) F/U TPN labs on 7/2 Monitor  TPN labs on Mon/Thurs   Thank you for allowing pharmacy to be a part of this patient's care.  Selinda Eon, PharmD, BCPS Clinical Pharmacist [place] 06/28/2023 2:50 PM

## 2023-06-28 NOTE — Anesthesia Preprocedure Evaluation (Addendum)
Anesthesia Evaluation  Patient identified by MRN, date of birth, ID band Patient awake    Reviewed: Allergy & Precautions, NPO status , Patient's Chart, lab work & pertinent test results  Airway Mallampati: II  TM Distance: >3 FB Neck ROM: Full    Dental  (+) Edentulous Lower, Edentulous Upper   Pulmonary Current Smoker and Patient abstained from smoking.   Pulmonary exam normal        Cardiovascular hypertension, Pt. on medications Normal cardiovascular exam     Neuro/Psych negative neurological ROS  negative psych ROS   GI/Hepatic ,,,(+)     substance abuse    Endo/Other  negative endocrine ROS    Renal/GU Renal disease     Musculoskeletal negative musculoskeletal ROS (+)    Abdominal   Peds  Hematology negative hematology ROS (+)   Anesthesia Other Findings SMALL BOWEL OBSTRUCTION  Reproductive/Obstetrics                             Anesthesia Physical Anesthesia Plan  ASA: 2  Anesthesia Plan: General   Post-op Pain Management:    Induction: Intravenous and Rapid sequence  PONV Risk Score and Plan: 3 and Ondansetron, Dexamethasone, Midazolam and Treatment may vary due to age or medical condition  Airway Management Planned: Oral ETT  Additional Equipment:   Intra-op Plan:   Post-operative Plan: Extubation in OR  Informed Consent: I have reviewed the patients History and Physical, chart, labs and discussed the procedure including the risks, benefits and alternatives for the proposed anesthesia with the patient or authorized representative who has indicated his/her understanding and acceptance.       Plan Discussed with: CRNA  Anesthesia Plan Comments:        Anesthesia Quick Evaluation

## 2023-06-28 NOTE — Progress Notes (Signed)
Subjective/Chief Complaint: No flatus or BM Still quite distended, although less distended than admission 8-hour follow-up film shows contrast remaining in duodenum. Frustrated at NG tube   Objective: Vital signs in last 24 hours: Temp:  [97.4 F (36.3 C)-97.9 F (36.6 C)] 97.9 F (36.6 C) (07/01 0521) Pulse Rate:  [76-84] 76 (07/01 0521) Resp:  [16-20] 16 (07/01 0521) BP: (131-140)/(76-93) 136/93 (07/01 0521) SpO2:  [98 %-100 %] 100 % (07/01 0521) Last BM Date : 06/17/23  Intake/Output from previous day: 06/30 0701 - 07/01 0700 In: 2178 [I.V.:2178] Out: 1700 [Urine:350; Emesis/NG output:1350] Intake/Output this shift: No intake/output data recorded.  General appearance: alert, cooperative, and no distress GI: distended; palpable loops of bowel; non-tender Small reducible umbilical hernia  Lab Results:  Recent Labs    06/27/23 0429 06/28/23 0413  WBC 6.1 6.4  HGB 13.4 13.5  HCT 41.7 43.0  PLT 181 193   BMET Recent Labs    06/27/23 0429 06/28/23 0413  NA 133* 139  K 3.8 3.7  CL 98 98  CO2 29 27  GLUCOSE 110* 85  BUN 55* 33*  CREATININE 1.37* 1.20*  CALCIUM 8.4* 8.7*    PT/INR No results for input(s): "LABPROT", "INR" in the last 72 hours. ABG No results for input(s): "PHART", "HCO3" in the last 72 hours.  Invalid input(s): "PCO2", "PO2"  Studies/Results: DG Abd Portable 1V-Small Bowel Obstruction Protocol-initial, 8 hr delay  Result Date: 06/27/2023 CLINICAL DATA:  SBO protocol, 8 hour delayed image. EXAM: PORTABLE ABDOMEN - 1 VIEW COMPARISON:  CT abdomen pelvis 06/26/2023 FINDINGS: Numerous gas-filled dilated small bowel loops throughout the abdomen, unchanged compared to 06/26/2023. Enteric tube tip and side hole project over the expected location of the upper gastric body. Oral contrast is seen within the stomach and in the medial right upper abdomen, in the expected location of the proximal duodenum. Flocculation of the barium suspension is  noted. IMPRESSION: Oral contrast is seen within the stomach and proximal duodenum. Persistent dilated gas-filled loops of small bowel throughout the abdomen, unchanged compared to 06/26/2023. Electronically Signed   By: Sherron Ales M.D.   On: 06/27/2023 19:28   DG Chest Portable 1 View  Result Date: 06/27/2023 CLINICAL DATA:  67 year old female NG tube placement. Small-bowel obstruction by CT. EXAM: PORTABLE CHEST 1 VIEW COMPARISON:  Chest CT 06/16/2023. CT Abdomen and Pelvis yesterday. FINDINGS: Portable AP semi upright view at 0437 hours. Enteric tube placed into the stomach, side hole at the level of the gastric fundus. Platelike left lung base atelectasis. No other confluent lung opacity. Stable cardiac size and mediastinal contours. Numerous gas-filled bowel loops in the visible abdomen not significantly changed from the CT Abdomen and Pelvis yesterday. IMPRESSION: Satisfactory enteric tube placement into the stomach. Electronically Signed   By: Odessa Fleming M.D.   On: 06/27/2023 04:54   CT ABDOMEN PELVIS W CONTRAST  Result Date: 06/27/2023 CLINICAL DATA:  Bowel obstruction, abdominal pain EXAM: CT ABDOMEN AND PELVIS WITH CONTRAST TECHNIQUE: Multidetector CT imaging of the abdomen and pelvis was performed using the standard protocol following bolus administration of intravenous contrast. RADIATION DOSE REDUCTION: This exam was performed according to the departmental dose-optimization program which includes automated exposure control, adjustment of the mA and/or kV according to patient size and/or use of iterative reconstruction technique. CONTRAST:  75mL OMNIPAQUE IOHEXOL 300 MG/ML  SOLN COMPARISON:  06/16/2023 FINDINGS: Lower chest: Elevation of the left hemidiaphragm again noted with mild left basilar atelectasis. Multiple nodular densities are again seen within  the visualized right lung base, similar prior examination, but indeterminate. Cardiac size within normal limits. Hepatobiliary: No focal liver  abnormality is seen. No gallstones, gallbladder wall thickening, or biliary dilatation. Pancreas: Unremarkable Spleen: Unremarkable Adrenals/Urinary Tract: The adrenal glands are unremarkable. The kidneys are normal. The bladder is unremarkable. Stomach/Bowel: The small bowel is diffusely dilated and fluid-filled with transition to a completely decompressed terminal ileum within the right upper quadrant in keeping with a distal small bowel obstruction. The exact point of transition is not clearly identified, but suspected at axial image # 20/2 and sagittal image # 36/10 within the right upper quadrant anterior to the right hepatic lobe. The terminal ileum and colon are decompressed. The appendix is unremarkable. No free intraperitoneal gas or fluid. Vascular/Lymphatic: Fusiform infrarenal abdominal aortic aneurysm is present with the abdominal aorta measuring 3.2 cm in greatest dimension on coronal image # 71/8. Moderate circumferential mural thrombus within the aneurysm sac. No pathologic adenopathy within the abdomen and pelvis. Reproductive: Uterus and bilateral adnexa are unremarkable. Other: No abdominal wall hernia or abnormality. No abdominopelvic ascites. Musculoskeletal: No acute bone abnormality. No lytic or blastic bone lesion. IMPRESSION: 1. Distal small bowel obstruction with transition point within the right upper quadrant anterior to the right hepatic lobe. 2. 3.2 cm infrarenal abdominal aortic aneurysm. Recommend follow-up ultrasound every 3 years. 3. Multiple right basilar indeterminate pulmonary nodules measuring up to 14 mm better assessed on prior CT examination. As noted on that exam, short-term follow-up imaging in 3 months is recommended to assess for stability or resolution. Aortic Atherosclerosis (ICD10-I70.0). Electronically Signed   By: Helyn Numbers M.D.   On: 06/27/2023 00:25    Anti-infectives: Anti-infectives (From admission, onward)    Start     Dose/Rate Route Frequency Ordered  Stop   06/28/23 1000  ceFAZolin (ANCEF) IVPB 2g/100 mL premix        2 g 200 mL/hr over 30 Minutes Intravenous  Once 06/28/23 0911         Assessment/Plan: Small bowel obstruction - not improving  Recommend exploratory laparotomy/ lysis of adhesions/ possible bowel resection.  The surgical procedure has been discussed with the patient.  Potential risks, benefits, alternative treatments, and expected outcomes have been explained.  All of the patient's questions at this time have been answered.  The likelihood of reaching the patient's treatment goal is good.  The patient understand the proposed surgical procedure and wishes to proceed.  I explained to her that she would still have a NG tube for a few days after surgery.  She might benefit from PICC/ TNA since this process has been going on for several weeks without normal PO intake.  We can evaluate after surgery.  LOS: 1 day    Wynona Luna 06/28/2023

## 2023-06-28 NOTE — Op Note (Signed)
Pre-op diagnosis: Small bowel obstruction Postop diagnosis: Same Procedure performed: Exploratory laparotomy, lysis of adhesions Surgeon:Janeene Sand K Leslyn Monda Assistant:  Leary Roca, PA-C Indications: This is a 67 year old female who presents with several weeks of abdominal pain and distention, nausea, vomiting.  She was evaluated initially in the emergency department 6/18 was noted to have an ileus.  She was discharged home with recommendations for stool softeners.  Her symptoms continued and worsened.  She has had poor open intake.  She returned on 6/29.  CT scan showed small bowel obstruction with a transition point in the right upper quadrant anterior to the liver.  We attempted conservative management with a nasogastric tube with no improvement.  I recommended exploratory laparotomy.  Description of procedure: Patient is brought to the operating room placed in the supine position on the operating room table.  After an adequate level general anesthesia was obtained, a Foley catheter was placed under sterile technique.  The patient's abdomen was prepped with ChloraPrep and draped in sterile fashion.  A timeout was taken to ensure the proper patient and proper procedure.  We made a vertical midline incision.  Dissection was carried down to the fascia.  The patient has a small umbilical defect.  We open the fascia widely throughout the length of the incision.  We entered the peritoneal cavity bluntly.  There are no adhesions to the anterior abdominal wall.  The small bowel was massively distended.  We then began eviscerated the small bowel out of the abdomen.  The ligament of Treitz was identified.  The small bowel is examined in antegrade fashion from ligament of Treitz towards the terminal ileum.  There seems to be some tight adhesions anterior to the liver edge to the right of the falciform ligament.  There seem to be's some thick chronic adhesions in this area.  We took down these adhesions with cautery.   This freed up the obvious small bowel obstruction.  We examined the area of obstruction and the small bowel appeared to be viable.  We could milk bowel contents back-and-forth across the relieved obstruction.  The small bowel dilated normally.  The small bowel distal to this area is decompressed down to the cecum.  We then milked the small bowel contents retrograde from terminal ileum back to the ligament of Treitz.  The nasogastric tube was repositioned within the stomach and were able to successfully decompress the small bowel.  Over 2 L of gastric contents were removed via the NG tube.  We inspected for hemostasis.  I took down the remainder of the adhesions to the anterior surface of the liver.  We again inspected for hemostasis.  We injected 20 cc of Exparel in the fascia.  The fascia was reapproximated with double-stranded #1 PDS suture.  The subcutaneous tissues were irrigated.  Staples were used to close the skin.  Honeycomb dressing is applied.  The patient was then extubated and brought to the recovery room in stable condition.  All sponge, instrument, and needle counts are correct.  Wilmon Arms. Corliss Skains, MD, [facility]  General Surgery   06/28/2023 1:13 PM

## 2023-06-28 NOTE — Anesthesia Procedure Notes (Signed)
Procedure Name: Intubation Date/Time: 06/28/2023 11:56 AM  Performed by: Deri Fuelling, CRNAPre-anesthesia Checklist: Patient identified, Emergency Drugs available, Suction available and Patient being monitored Patient Re-evaluated:Patient Re-evaluated prior to induction Oxygen Delivery Method: Circle system utilized Preoxygenation: Pre-oxygenation with 100% oxygen Induction Type: IV induction Ventilation: Mask ventilation without difficulty Laryngoscope Size: Mac and 3 Grade View: Grade I Tube type: Oral Number of attempts: 1 Airway Equipment and Method: Stylet and Oral airway Placement Confirmation: ETT inserted through vocal cords under direct vision, positive ETCO2 and breath sounds checked- equal and bilateral Secured at: 21 cm Tube secured with: Tape Dental Injury: Teeth and Oropharynx as per pre-operative assessment

## 2023-06-28 NOTE — Transfer of Care (Signed)
Immediate Anesthesia Transfer of Care Note  Patient: Deanna Harrell  Procedure(s) Performed: EXPLORATORY LAPAROTOMY and LYSIS OF ADHESIONS  Patient Location: PACU  Anesthesia Type:General  Level of Consciousness: awake, alert , and oriented  Airway & Oxygen Therapy: Patient Spontanous Breathing and Patient connected to face mask oxygen  Post-op Assessment: Report given to RN, Post -op Vital signs reviewed and stable, and Patient moving all extremities X 4  Post vital signs: Reviewed and stable  Last Vitals:  Vitals Value Taken Time  BP 189/106 06/28/23 1317  Temp    Pulse 65 06/28/23 1318  Resp 11 06/28/23 1319  SpO2 97 % 06/28/23 1318  Vitals shown include unvalidated device data.  Last Pain:  Vitals:   06/28/23 1002  TempSrc: Oral  PainSc:       Patients Stated Pain Goal: 2 (06/27/23 2359)  Complications: No notable events documented.

## 2023-06-29 ENCOUNTER — Inpatient Hospital Stay (HOSPITAL_COMMUNITY): Payer: 59

## 2023-06-29 ENCOUNTER — Encounter (HOSPITAL_COMMUNITY): Payer: Self-pay | Admitting: Surgery

## 2023-06-29 DIAGNOSIS — K56609 Unspecified intestinal obstruction, unspecified as to partial versus complete obstruction: Secondary | ICD-10-CM | POA: Diagnosis not present

## 2023-06-29 LAB — CBC
HCT: 41.7 % (ref 36.0–46.0)
Hemoglobin: 13.3 g/dL (ref 12.0–15.0)
MCH: 29.2 pg (ref 26.0–34.0)
MCHC: 31.9 g/dL (ref 30.0–36.0)
MCV: 91.6 fL (ref 80.0–100.0)
Platelets: 207 10*3/uL (ref 150–400)
RBC: 4.55 MIL/uL (ref 3.87–5.11)
RDW: 13.5 % (ref 11.5–15.5)
WBC: 6.9 10*3/uL (ref 4.0–10.5)
nRBC: 0 % (ref 0.0–0.2)

## 2023-06-29 LAB — PHOSPHORUS: Phosphorus: 3.3 mg/dL (ref 2.5–4.6)

## 2023-06-29 LAB — COMPREHENSIVE METABOLIC PANEL
ALT: 14 U/L (ref 0–44)
AST: 23 U/L (ref 15–41)
Albumin: 2.6 g/dL — ABNORMAL LOW (ref 3.5–5.0)
Alkaline Phosphatase: 46 U/L (ref 38–126)
Anion gap: 12 (ref 5–15)
BUN: 23 mg/dL (ref 8–23)
CO2: 28 mmol/L (ref 22–32)
Calcium: 8.3 mg/dL — ABNORMAL LOW (ref 8.9–10.3)
Chloride: 97 mmol/L — ABNORMAL LOW (ref 98–111)
Creatinine, Ser: 1.19 mg/dL — ABNORMAL HIGH (ref 0.44–1.00)
GFR, Estimated: 50 mL/min — ABNORMAL LOW (ref >60)
Glucose, Bld: 99 mg/dL (ref 70–99)
Potassium: 3.8 mmol/L (ref 3.5–5.1)
Sodium: 137 mmol/L (ref 135–145)
Total Bilirubin: 0.9 mg/dL (ref 0.3–1.2)
Total Protein: 5.6 g/dL — ABNORMAL LOW (ref 6.5–8.1)

## 2023-06-29 LAB — MAGNESIUM: Magnesium: 2 mg/dL (ref 1.7–2.4)

## 2023-06-29 LAB — TRIGLYCERIDES: Triglycerides: 82 mg/dL (ref <150)

## 2023-06-29 MED ORDER — NICOTINE 14 MG/24HR TD PT24: 14.0000 mg | MEDICATED_PATCH | Freq: Every day | TRANSDERMAL | Status: DC

## 2023-06-29 MED ORDER — CARMEX CLASSIC LIP BALM EX OINT
TOPICAL_OINTMENT | CUTANEOUS | Status: DC | PRN
  Administered 2023-06-29: 1 via TOPICAL
  Filled 2023-06-29: qty 10

## 2023-06-29 MED ORDER — TRAVASOL 10 % IV SOLN
INTRAVENOUS | Status: DC
  Filled 2023-06-29: qty 460.8

## 2023-06-29 MED ORDER — ENOXAPARIN SODIUM 40 MG/0.4ML IJ SOSY
40.0000 mg | PREFILLED_SYRINGE | Freq: Every day | INTRAMUSCULAR | Status: DC
  Administered 2023-06-29 – 2023-07-03 (×5): 40 mg via SUBCUTANEOUS
  Filled 2023-06-29 (×5): qty 0.4

## 2023-06-29 NOTE — Progress Notes (Signed)
1 Day Post-Op  Subjective: CC: Some soreness around her incision post op. Required IV Dilaudid x 1 since surgery. Pain is well controlled. No nausea. NGT bilious. No flatus or bm. Foley in place with straw colored urine in foley bag, 0.68ml/kg/hr over the last 24 hours. Mobilizing in the halls.   Afebrile. No tachycardia or hypotension. WBC wnl at 6.9. Hgb stable at 13.3 (13.5). Cr stable/slightly improved at 1.19 (1.2).   Objective: Vital signs in last 24 hours: Temp:  [96.1 F (35.6 C)-98.2 F (36.8 C)] 97.5 F (36.4 C) (07/02 0505) Pulse Rate:  [63-91] 78 (07/02 0505) Resp:  [9-18] 18 (07/02 0505) BP: (121-189)/(71-106) 139/82 (07/02 0505) SpO2:  [91 %-99 %] 91 % (07/02 0505) Weight:  [49.9 kg-58.5 kg] 58.5 kg (07/02 0553) Last BM Date : 06/17/23  Intake/Output from previous day: 07/01 0701 - 07/02 0700 In: 3801.2 [P.O.:420; I.V.:2671.2; IV Piggyback:710] Out: 3675 [Urine:850; Emesis/NG output:1000; Blood:25] Intake/Output this shift: No intake/output data recorded.  PE: Gen:  Alert, NAD, pleasant Card:  Reg Pulm:  Rate and effort normal Abd: Soft, mild distension, appropriately tender around midline incision, no rigidity or guarding and otherwise NT. Hypoactive BS. Midline incision with honeycomb dressing in place - there is some dried blood on the inferior aspect of the honeycomb dressing but no evidence of active bleeding and is otherwise cdi. NGT in place with bilious output   Lab Results:  Recent Labs    06/28/23 0413 06/29/23 0514  WBC 6.4 6.9  HGB 13.5 13.3  HCT 43.0 41.7  PLT 193 207   BMET Recent Labs    06/28/23 0413 06/29/23 0514  NA 139 137  K 3.7 3.8  CL 98 97*  CO2 27 28  GLUCOSE 85 99  BUN 33* 23  CREATININE 1.20* 1.19*  CALCIUM 8.7* 8.3*   PT/INR No results for input(s): "LABPROT", "INR" in the last 72 hours. CMP     Component Value Date/Time   NA 137 06/29/2023 0514   NA 143 02/08/2020 1029   K 3.8 06/29/2023 0514   CL 97 (L)  06/29/2023 0514   CO2 28 06/29/2023 0514   GLUCOSE 99 06/29/2023 0514   BUN 23 06/29/2023 0514   BUN 10 02/08/2020 1029   CREATININE 1.19 (H) 06/29/2023 0514   CALCIUM 8.3 (L) 06/29/2023 0514   PROT 5.6 (L) 06/29/2023 0514   PROT 7.1 02/08/2020 1029   ALBUMIN 2.6 (L) 06/29/2023 0514   ALBUMIN 4.4 02/08/2020 1029   AST 23 06/29/2023 0514   ALT 14 06/29/2023 0514   ALKPHOS 46 06/29/2023 0514   BILITOT 0.9 06/29/2023 0514   BILITOT 0.3 02/08/2020 1029   GFRNONAA 50 (L) 06/29/2023 0514   GFRAA 75 02/08/2020 1029   Lipase     Component Value Date/Time   LIPASE 35 06/26/2023 2256    Studies/Results: [place] EKG SITE RITE  Result Date: 06/28/2023 If Site Rite image not attached, placement could not be confirmed due to current cardiac rhythm.  DG Abd Portable 1V-Small Bowel Obstruction Protocol-initial, 8 hr delay  Result Date: 06/27/2023 CLINICAL DATA:  SBO protocol, 8 hour delayed image. EXAM: PORTABLE ABDOMEN - 1 VIEW COMPARISON:  CT abdomen pelvis 06/26/2023 FINDINGS: Numerous gas-filled dilated small bowel loops throughout the abdomen, unchanged compared to 06/26/2023. Enteric tube tip and side hole project over the expected location of the upper gastric body. Oral contrast is seen within the stomach and in the medial right upper abdomen, in the expected location of the  proximal duodenum. Flocculation of the barium suspension is noted. IMPRESSION: Oral contrast is seen within the stomach and proximal duodenum. Persistent dilated gas-filled loops of small bowel throughout the abdomen, unchanged compared to 06/26/2023. Electronically Signed   By: Sherron Ales M.D.   On: 06/27/2023 19:28    Anti-infectives: Anti-infectives (From admission, onward)    Start     Dose/Rate Route Frequency Ordered Stop   06/28/23 0915  ceFAZolin (ANCEF) IVPB 2g/100 mL premix        2 g 200 mL/hr over 30 Minutes Intravenous On call to O.R. 06/28/23 0911 06/28/23 1215        Assessment/Plan POD 1 s/p  Exploratory laparotomy, lysis of adhesions for SBO by Dr. Corliss Skains - 06/28/23 - Cont NGT, AROBF - Cont TPN - D/c Foley, TOV - Mobilize. Okay to clamp NGT for mobilization - Pulm toilet   FEN - NPO, NGT to LIWS, IVF per TRH VTE - SCDs, start Lovenox ID - Ancef peri-op. None currently. Afebrile. WBC wnl Foley - In place, d/c today   LOS: 2 days    Jacinto Halim , [facility] Surgery 06/29/2023, 8:05 AM Please see Amion for pager number during day hours 7:00am-4:30pm

## 2023-06-29 NOTE — Progress Notes (Signed)
Mobility Specialist - Progress Note   06/29/23 1100  Mobility  Activity Ambulated with assistance in hallway  Level of Assistance Standby assist, set-up cues, supervision of patient - no hands on  Assistive Device Front wheel walker  Distance Ambulated (ft) 250 ft  Activity Response Tolerated well  Mobility Referral Yes  $Mobility charge 1 Mobility  Mobility Specialist Start Time (ACUTE ONLY) 1052  Mobility Specialist Stop Time (ACUTE ONLY) 1121  Mobility Specialist Time Calculation (min) (ACUTE ONLY) 29 min   Pt received in recliner and agreeable to mobility. No complaints during session. Pt to recliner after session with all needs met. Chair alarm on.   [facility]

## 2023-06-29 NOTE — TOC Progression Note (Signed)
Transition of Care [facility]) - Progression Note    Patient Details  Name: Manuel Cortese MRN: [id number] Date of Birth: 03-25-1956  Transition of Care [facility]) CM/SW Contact  Coralyn Helling, [place] Phone Number: 06/29/2023, 2:34 PM  Clinical Narrative:     Transition of Care [facility]) - Inpatient Brief Assessment   Patient Details  Name: Graceson Stecklein MRN: [id number] Date of Birth: 17-Dec-1956  Transition of Care [facility]) CM/SW Contact:    Coralyn Helling, LCSW Phone Number: 06/29/2023, 2:34 PM   Clinical Narrative: No needs identified at time of screening. Food resources added to patient AVS.    Transition of Care Asessment: Insurance and Status: Insurance coverage has been reviewed Patient has primary care physician: Yes Home environment has been reviewed: Lives with niece Prior level of function:: Independent Prior/Current Home Services: No current home services Social Determinants of Health Reivew: SDOH reviewed interventions complete (resources added to AVS) Readmission risk has been reviewed: Yes Transition of care needs: no transition of care needs at this time        Expected Discharge Plan and Services                                               Social Determinants of Health (SDOH) Interventions SDOH Screenings   Food Insecurity: Food Insecurity Present (06/27/2023)  Housing: Low Risk  (06/27/2023)  Transportation Needs: No Transportation Needs (06/27/2023)  Utilities: Not At Risk (06/27/2023)  Depression (PHQ2-9): Low Risk  (09/02/2022)  Financial Resource Strain: Medium Risk (09/02/2022)  Physical Activity: Inactive (09/02/2022)  Stress: No Stress Concern Present (08/27/2021)  Tobacco Use: High Risk (06/29/2023)    Readmission Risk Interventions    06/29/2023    2:32 PM  Readmission Risk Prevention Plan  Transportation Screening Complete

## 2023-06-29 NOTE — Anesthesia Postprocedure Evaluation (Signed)
Anesthesia Post Note  Patient: Karlie Saker  Procedure(s) Performed: EXPLORATORY LAPAROTOMY and LYSIS OF ADHESIONS     Patient location during evaluation: PACU Anesthesia Type: General Level of consciousness: awake and alert Pain management: pain level controlled Vital Signs Assessment: post-procedure vital signs reviewed and stable Respiratory status: spontaneous breathing, nonlabored ventilation, respiratory function stable and patient connected to nasal cannula oxygen Cardiovascular status: blood pressure returned to baseline and stable Postop Assessment: no apparent nausea or vomiting Anesthetic complications: no   No notable events documented.  Last Vitals:  Vitals:   06/29/23 0505 06/29/23 1004  BP: 139/82 (!) 153/88  Pulse: 78 82  Resp: 18 15  Temp: (!) 36.4 C 36.7 C  SpO2: 91% 100%    Last Pain:  Vitals:   06/29/23 1004  TempSrc: Oral  PainSc:                  Kennieth Rad

## 2023-06-29 NOTE — Progress Notes (Signed)
Pt very nasty w staff this am, cursing out NT/RN for trivial issues (wanted to walk to the BR backwards w her IV pole). Attempted to speak w pt about her concerns, pt cont to be verbally aggressive and cursing at staff. Pt repeatedly asking to "go outside" . Marland Kitchen ?? To smoke cigarettes. Instructed pt that she is hooked up to too many tubes right  now and needs MD order to do this. MD/AD notified

## 2023-06-29 NOTE — Progress Notes (Addendum)
PROGRESS NOTE    Deanna Harrell  ZOX:[id number] DOB: 02-03-1956 DOA: 06/26/2023 PCP: Ellender Hose, NP  Chief Complaint  Patient presents with   Abdominal Pain   Constipation    Brief Narrative:   [name] is Deanna Harrell 67 y.o. female with medical history significant of hypertension, substance abuse who presented with Eugene Isadore small bowel obstruction.  Assessment & Plan:   Principal Problem:   SBO (small bowel obstruction) (HCC)  Small bowel obstruction Appreciate surgical team assistance S/p Exploratory laparotomy, lysis of adhesions (7/1) NGT per surgery, planning for TPN Appreciate surgery recommendations - not too much from medical standpoint here, will discuss with surgery if they'd be willing to take over as primary   Acute kidney injury secondary to volume depletion Improving gradually Baseline creatinine 0.78   Hypertension Amlodipine currently on hold   Substance abuse Tobacco Abuse UDS positive for opiates and THC Known history of prior cocaine use Continues to use tobacco products   Pulmonary nodules Seen on CT scan with radiologist recommending short-term follow-up in 3 months to determine stability    DVT prophylaxis: SCD Code Status: full Family Communication: none Disposition:   Status is: Inpatient Remains inpatient appropriate because: need for surgery, continued inpatient care   Consultants:  surgery  Procedures:  Exploratory laparotomy, lysis of adhesions   Antimicrobials:  Anti-infectives (From admission, onward)    Start     Dose/Rate Route Frequency Ordered Stop   06/28/23 0915  ceFAZolin (ANCEF) IVPB 2g/100 mL premix        2 g 200 mL/hr over 30 Minutes Intravenous On call to O.R. 06/28/23 0911 06/28/23 1215       Subjective: Asks me what I do Says I don't do anything (discussed that general surgery is directing her care, but as hospitalist, we're managing other chronic medical issues)  Objective: Vitals:   06/29/23 0023 06/29/23  0505 06/29/23 0553 06/29/23 1004  BP: 126/71 139/82  (!) 153/88  Pulse: 69 78  82  Resp: 18 18  15   Temp: 97.6 F (36.4 C) (!) 97.5 F (36.4 C)  98.1 F (36.7 C)  TempSrc: Oral Oral  Oral  SpO2: 96% 91%  100%  Weight:   58.5 kg   Height:        Intake/Output Summary (Last 24 hours) at 06/29/2023 1013 Last data filed at 06/29/2023 0900 Gross per 24 hour  Intake 3741.23 ml  Output 3925 ml  Net -183.77 ml   Filed Weights   06/26/23 2141 06/28/23 1001 06/29/23 0553  Weight: 49.9 kg 49.9 kg 58.5 kg    Examination:  General: No acute distress. Lungs: unlabored Abdomen: midline dressing Neurological: Alert and oriented 3. Moves all extremities 4 with equal strength. Cranial nerves II through XII grossly intact. Extremities: No clubbing or cyanosis. No edema.  Data Reviewed: I have personally reviewed following labs and imaging studies  CBC: Recent Labs  Lab 06/26/23 2256 06/26/23 2320 06/27/23 0429 06/28/23 0413 06/29/23 0514  WBC 6.2  --  6.1 6.4 6.9  NEUTROABS 4.7  --  3.6 4.1  --   HGB 16.2* 17.3* 13.4 13.5 13.3  HCT 49.5* 51.0* 41.7 43.0 41.7  MCV 88.6  --  90.7 92.9 91.6  PLT 225  --  181 193 207    Basic Metabolic Panel: Recent Labs  Lab 06/26/23 2256 06/26/23 2320 06/27/23 0429 06/28/23 0413 06/29/23 0514  NA 134* 134* 133* 139 137  K 3.7 3.8 3.8 3.7 3.8  CL 91* 97* 98 98  97*  CO2 28  --  29 27 28   GLUCOSE 144* 143* 110* 85 99  BUN 70* 65* 55* 33* 23  CREATININE 1.62* 1.70* 1.37* 1.20* 1.19*  CALCIUM 9.2  --  8.4* 8.7* 8.3*  MG  --   --  2.6* 2.5* 2.0  PHOS  --   --   --  3.1 3.3    GFR: Estimated Creatinine Clearance: 42.4 mL/min (Yazmen Briones) (by C-G formula based on SCr of 1.19 mg/dL (H)).  Liver Function Tests: Recent Labs  Lab 06/26/23 2256 06/27/23 0429 06/28/23 0413 06/29/23 0514  AST 34 23 20 23   ALT 22 18 17 14   ALKPHOS 71 62 51 46  BILITOT 0.7 0.7 1.0 0.9  PROT 7.7 6.3* 6.1* 5.6*  ALBUMIN 3.9 3.2* 3.1* 2.6*    CBG: No results  for input(s): "GLUCAP" in the last 168 hours.   No results found for this or any previous visit (from the past 240 hour(s)).       Radiology Studies: [place] EKG SITE RITE  Result Date: 06/28/2023 If Site Rite image not attached, placement could not be confirmed due to current cardiac rhythm.  DG Abd Portable 1V-Small Bowel Obstruction Protocol-initial, 8 hr delay  Result Date: 06/27/2023 CLINICAL DATA:  SBO protocol, 8 hour delayed image. EXAM: PORTABLE ABDOMEN - 1 VIEW COMPARISON:  CT abdomen pelvis 06/26/2023 FINDINGS: Numerous gas-filled dilated small bowel loops throughout the abdomen, unchanged compared to 06/26/2023. Enteric tube tip and side hole project over the expected location of the upper gastric body. Oral contrast is seen within the stomach and in the medial right upper abdomen, in the expected location of the proximal duodenum. Flocculation of the barium suspension is noted. IMPRESSION: Oral contrast is seen within the stomach and proximal duodenum. Persistent dilated gas-filled loops of small bowel throughout the abdomen, unchanged compared to 06/26/2023. Electronically Signed   By: Sherron Ales M.D.   On: 06/27/2023 19:28        Scheduled Meds:  enoxaparin (LOVENOX) injection  40 mg Subcutaneous Daily   pantoprazole (PROTONIX) IV  40 mg Intravenous Q24H   Continuous Infusions:  acetaminophen 1,000 mg (06/29/23 0558)   lactated ringers 100 mL/hr at 06/28/23 2002   methocarbamol (ROBAXIN) IV 1,000 mg (06/29/23 0516)     LOS: 2 days    Time spent: over 30 min    Lacretia Nicks, MD [facility]   To contact the attending provider between 7A-7P or the covering provider during after hours 7P-7A, please log into the web site [email or web address] and access using universal [place] password for that web site. If you do not have the password, please call the hospital operator.  06/29/2023, 10:13 AM

## 2023-06-29 NOTE — Progress Notes (Signed)
Initial Nutrition Assessment  INTERVENTION:   -TPN management per Pharmacy -Daily weights while on TPN  NUTRITION DIAGNOSIS:   Inadequate oral intake related to acute illness (SBO) as evidenced by NPO status.  GOAL:   Patient will meet greater than or equal to 90% of their needs  MONITOR:   Labs, Weight trends, Diet advancement, I & O's (TPN)  REASON FOR ASSESSMENT:   Consult New TPN/TNA  ASSESSMENT:   67 y.o. female with medical history significant of hypertension, substance abuse who presented with a small bowel obstruction.  6/29: admitted 7/1: s/p ex lap, LOA  Patient in room, sitting in chair, family member at bedside. Pt very irritable. Reports she is very hungry and wants some eggs. Explained to pt that she is NPO and that anything she consumes would come back up through the tube. Pt with no questions regarding TPN, not very receptive to RD visit.   Pt did share she ate [employer] and some applesauce on 6/29, that was the last time she ate anything.   TPN to begin today at 40 ml/hr, providing ~981 kcals and 46g protein.  Pt reports UBW ~110 lbs.  Current weight: 129 lbs. Fluid?  Medications: Lactated ringers  Labs reviewed: UDS+ opiates, THC  NUTRITION - FOCUSED PHYSICAL EXAM:  Pt deferred. Was not receptive.  Diet Order:   Diet Order             Diet NPO time specified Except for: Ice Chips  Diet effective now                   EDUCATION NEEDS:   No education needs have been identified at this time  Skin:  Skin Assessment: Skin Integrity Issues: Skin Integrity Issues:: Incisions Incisions: 7/1 abdomen  Last BM:  7/1  Height:   Ht Readings from Last 1 Encounters:  06/28/23 5\' 7"  (1.702 m)    Weight:   Wt Readings from Last 1 Encounters:  06/29/23 58.5 kg    BMI:  Body mass index is 20.2 kg/m.  Estimated Nutritional Needs:   Kcal:  1800-2000  Protein:  75-90g  Fluid:  1.8L/day   Tilda Franco, MS, RD, LDN Inpatient  Clinical Dietitian Contact information available via Amion

## 2023-06-29 NOTE — Progress Notes (Signed)
PHARMACY - TOTAL PARENTERAL NUTRITION CONSULT NOTE   Indication: Prolonged ileus  Patient Measurements: Height: 5\' 7"  (170.2 cm) Weight: 58.5 kg (129 lb) IBW/kg (Calculated) : 61.6 TPN AdjBW (KG): 49.9 Body mass index is 20.2 kg/m. Usual Weight:    - 6/18 weight 52.2 kg  Assessment:  Pharmacy consulted to manage TPN for this 67 year old female who presented with several weeks of abdominal pain and distention, nausea, vomiting. CT revealed ileus on 6/18.  Discharged with improvement but then her symptoms continued and worsened. She returned on 6/29 to ED. CT scan showed small bowel obstruction. Conservative management attempted with a nasogastric tube with no improvement. Today, exploratory laparotomy, lysis of adhesions performed. Glucose / Insulin: No history of DM. Goal CBGs 100-180; no hyperglycemia prior to initiation of TPN Electrolytes: WNL including corrected calcium=9.42 Renal: SCr remains slightly elevated at 1.19, BUN now WNL Hepatic: Albumin low at 2.6, pre-albumin low at 14 AST & ALT WNL Intake / Output; MIVF: LR at 100 ml/hr GI Imaging: 6/30 DG Abd: Persistent dilated gas-filled loops of small bowel throughout the abdomen, unchanged compared to 06/26/2023. 6/30 DG Chest: Satisfactory enteric tube placement into the stomach.  6/29 CT Abdomen Pelvis:  Distal small bowel obstruction with transition point within the right upper quadrant anterior to the right hepatic lobe. GI Surgeries / Procedures:  7/1-exploratory laparotomy, lysis of adhesions Central access: PICC ordered TPN start date: 7/2  Nutritional Goals: To be determined Goal TPN rate is _____ mL/hr (provides _____ g of protein and _____ kcals per day)  RD Assessment: to be completed    Current Nutrition:  NPO  Plan:  At 1800 if PICC in place: Initiate TPN at 40 mL/hr (current formulation at goal rate of 75 ml/hr would provide 86 g of protein and 1840 kcal/day) Electrolytes in TPN: Na 50 mEq/L  K 50  mEq/L  Ca 5 mEq/L  Mg 5 mEq/L Phos 15 mmol/L  Cl:Ac 1:1 Add standard MVI and trace elements to TPN No SSI Change MIVF to 60 mL/hr  Monitor TPN labs on Mon/Thurs BMET, magnesium, phosphorus tomorrow   Thank you for allowing pharmacy to be a part of this patient's care.  Selinda Eon, PharmD, BCPS Clinical Pharmacist [place] 06/29/2023 9:00 AM

## 2023-06-30 LAB — BASIC METABOLIC PANEL
Anion gap: 10 (ref 5–15)
BUN: 19 mg/dL (ref 8–23)
CO2: 28 mmol/L (ref 22–32)
Calcium: 8.1 mg/dL — ABNORMAL LOW (ref 8.9–10.3)
Chloride: 97 mmol/L — ABNORMAL LOW (ref 98–111)
Creatinine, Ser: 1.1 mg/dL — ABNORMAL HIGH (ref 0.44–1.00)
GFR, Estimated: 55 mL/min — ABNORMAL LOW (ref >60)
Glucose, Bld: 94 mg/dL (ref 70–99)
Potassium: 3.6 mmol/L (ref 3.5–5.1)
Sodium: 135 mmol/L (ref 135–145)

## 2023-06-30 NOTE — Progress Notes (Signed)
Nutrition Follow-up  INTERVENTION:   -Monitor for diet advancement  Once diet advanced: recommend Ensure Plus High Protein po BID, each supplement provides 350 kcal and 20 grams of protein.   NUTRITION DIAGNOSIS:   Inadequate oral intake related to acute illness (SBO) as evidenced by NPO status.  On clear liquids currently.  GOAL:   Patient will meet greater than or equal to 90% of their needs  Not meeting.  MONITOR:   Labs, Weight trends, Diet advancement, I & O's    ASSESSMENT:   67 y.o. female with medical history significant of hypertension, substance abuse who presented with a small bowel obstruction.  6/29: admitted 7/1: s/p ex lap, LOA  TPN cancelled yesterday per surgery given pt's bowel function returning. Pt just on clear liquids. NGT was clamped. Will monitor for further diet advancement. No PO for 4 days now.  7/2 Weight: 129 lbs Current weight: 131 lbs Will monitor weight trends.   Medications: Lactated ringers  Labs reviewed.  Diet Order:   Diet Order             Diet clear liquid Room service appropriate? Yes; Fluid consistency: Thin  Diet effective now                   EDUCATION NEEDS:   No education needs have been identified at this time  Skin:  Skin Assessment: Skin Integrity Issues: Skin Integrity Issues:: Incisions Incisions: 7/1 abdomen  Last BM:  7/1  Height:   Ht Readings from Last 1 Encounters:  06/28/23 5\' 7"  (1.702 m)    Weight:   Wt Readings from Last 1 Encounters:  06/30/23 59.5 kg   BMI:  Body mass index is 20.54 kg/m.  Estimated Nutritional Needs:   Kcal:  1800-2000  Protein:  75-90g  Fluid:  1.8L/day   Tilda Franco, MS, RD, LDN Inpatient Clinical Dietitian Contact information available via Amion

## 2023-06-30 NOTE — Progress Notes (Signed)
Pt has tolerated NG tube clamping until now. Pt called out stating she felt nauseated and pain a 9 out of 10 in her belly. Belly distended but soft, active BS noted in all quads. NG tube hooked back up to LWIS w no drainage noted. NG tube patency verified. PRN pain/nausea meds given, instructed pt to hold off on all liquids until this episode passes. She verbalized understanding.

## 2023-06-30 NOTE — Progress Notes (Signed)
2 Days Post-Op  Subjective: CC: Some soreness around her incision post op. Required IV Dilaudid x 1 since surgery. Pain is well controlled. No nausea. Minimal bilious output Patient reports some flatus and a small amount of diarrhea  Foley out and voiding without issues.   Mobilizing in the halls with mobility tech (232ft with RW)  Afebrile. No tachycardia or hypotension.   Objective: Vital signs in last 24 hours: Temp:  [98.1 F (36.7 C)-98.3 F (36.8 C)] 98.3 F (36.8 C) (07/03 0451) Pulse Rate:  [74-82] 74 (07/03 0451) Resp:  [15-18] 18 (07/03 0451) BP: (132-153)/(76-88) 132/76 (07/03 0451) SpO2:  [100 %] 100 % (07/03 0451) Weight:  [59.5 kg] 59.5 kg (07/03 0500) Last BM Date : 06/28/23  Intake/Output from previous day: 07/02 0701 - 07/03 0700 In: 2307.6 [P.O.:510; I.V.:1287.6; IV Piggyback:510] Out: 1200 [Urine:1050; Emesis/NG output:150] Intake/Output this shift: Total I/O In: 0  Out: 400 [Urine:400]  PE: Gen:  Alert, NAD, pleasant Card:  Reg Pulm:  Rate and effort normal Abd: Soft, mild distension, appropriately tender around midline incision, no rigidity or guarding and otherwise NT. Hypoactive BS. Midline incision with honeycomb dressing in place - there is some dried blood on the inferior aspect of the honeycomb dressing but no evidence of active bleeding and is otherwise cdi. NGT in place with minimal bilious output   Lab Results:  Recent Labs    06/28/23 0413 06/29/23 0514  WBC 6.4 6.9  HGB 13.5 13.3  HCT 43.0 41.7  PLT 193 207    BMET Recent Labs    06/29/23 0514 06/30/23 0458  NA 137 135  K 3.8 3.6  CL 97* 97*  CO2 28 28  GLUCOSE 99 94  BUN 23 19  CREATININE 1.19* 1.10*  CALCIUM 8.3* 8.1*    PT/INR No results for input(s): "LABPROT", "INR" in the last 72 hours. CMP     Component Value Date/Time   NA 135 06/30/2023 0458   NA 143 02/08/2020 1029   K 3.6 06/30/2023 0458   CL 97 (L) 06/30/2023 0458   CO2 28 06/30/2023 0458    GLUCOSE 94 06/30/2023 0458   BUN 19 06/30/2023 0458   BUN 10 02/08/2020 1029   CREATININE 1.10 (H) 06/30/2023 0458   CALCIUM 8.1 (L) 06/30/2023 0458   PROT 5.6 (L) 06/29/2023 0514   PROT 7.1 02/08/2020 1029   ALBUMIN 2.6 (L) 06/29/2023 0514   ALBUMIN 4.4 02/08/2020 1029   AST 23 06/29/2023 0514   ALT 14 06/29/2023 0514   ALKPHOS 46 06/29/2023 0514   BILITOT 0.9 06/29/2023 0514   BILITOT 0.3 02/08/2020 1029   GFRNONAA 55 (L) 06/30/2023 0458   GFRAA 75 02/08/2020 1029   Lipase     Component Value Date/Time   LIPASE 35 06/26/2023 2256    Studies/Results: DG Abd Portable 1V  Result Date: 06/29/2023 CLINICAL DATA:  Nasogastric tube placement EXAM: PORTABLE ABDOMEN - 1 VIEW COMPARISON:  06/27/2023 FINDINGS: The nasogastric tube extends into the decompressed stomach. A few distended small bowel loops are noted in the visualized upper abdomen. The lower abdomen is excluded. Linear subsegmental atelectasis or scarring at the left lung base as before. Midline skin staples over the abdomen. Aortic Atherosclerosis (ICD10-170.0). IMPRESSION: Nasogastric tube into decompressed stomach. Electronically Signed   By: [name] M.D.   On: 06/29/2023 15:29   [place] EKG SITE RITE  Result Date: 06/28/2023 If Site Rite image not attached, placement could not be confirmed due to current cardiac  rhythm.   Anti-infectives: Anti-infectives (From admission, onward)    Start     Dose/Rate Route Frequency Ordered Stop   06/28/23 0915  ceFAZolin (ANCEF) IVPB 2g/100 mL premix        2 g 200 mL/hr over 30 Minutes Intravenous On call to O.R. 06/28/23 0911 06/28/23 1215        Assessment/Plan POD 2 s/p Exploratory laparotomy, lysis of adhesions for SBO by Dr. Corliss Skains - 06/28/23 - Clamp NG tube - Clear liquids - Mobilize. Okay to clamp NGT for mobilization - Pulm toilet  - Transfer to CCS service as primary team  FEN - Clear liquids; clamp NG tube VTE - SCDs, Lovenox  ID - Ancef peri-op. None currently.  Afebrile. WBC wnl Foley - Out, voiding. Cr downtrending, 1.1 today     LOS: 3 days    Jacinto Halim , [facility] Surgery 06/30/2023, 9:12 AM Please see Amion for pager number during day hours 7:00am-4:30pm  Wilmon Arms. Corliss Skains, MD, [facility]  General Surgery   06/30/2023 10:41 AM

## 2023-06-30 NOTE — Progress Notes (Signed)
Mobility Specialist - Progress Note   06/30/23 0928  Mobility  Activity Ambulated with assistance in hallway  Level of Assistance Standby assist, set-up cues, supervision of patient - no hands on  Assistive Device Front wheel walker  Distance Ambulated (ft) 480 ft  Activity Response Tolerated well  Mobility Referral Yes  $Mobility charge 1 Mobility  Mobility Specialist Start Time (ACUTE ONLY) 0859  Mobility Specialist Stop Time (ACUTE ONLY) [id number]  Mobility Specialist Time Calculation (min) (ACUTE ONLY) 28 min   Pt received om BSC and agreeable to mobility. No complaints during session. Pt to recliner after session with all needs met. Chair alarm on.   [facility]

## 2023-06-30 NOTE — Care Management Important Message (Signed)
Important Message  Patient Details IM Letter given. Name: Alaijha Dile MRN: [id number] Date of Birth: [birth year]   Medicare Important Message Given:  Yes     Caren Macadam 06/30/2023, 3:31 PM

## 2023-07-01 MED ORDER — ENSURE ENLIVE PO LIQD
237.0000 mL | Freq: Two times a day (BID) | ORAL | Status: DC
  Administered 2023-07-01 – 2023-07-03 (×3): 237 mL via ORAL

## 2023-07-01 MED ORDER — PANTOPRAZOLE SODIUM 40 MG PO TBEC
40.0000 mg | DELAYED_RELEASE_TABLET | Freq: Every day | ORAL | Status: DC
  Administered 2023-07-01 – 2023-07-03 (×3): 40 mg via ORAL
  Filled 2023-07-01 (×4): qty 1

## 2023-07-01 NOTE — Progress Notes (Signed)
3 Days Post-Op   Subjective/Chief Complaint: Patient tolerating clamped NG Some abdominal pain Continues to pass flatus and had a small BM   Objective: Vital signs in last 24 hours: Temp:  [98.5 F (36.9 C)-98.6 F (37 C)] 98.5 F (36.9 C) (07/04 0542) Pulse Rate:  [72-77] 72 (07/04 0542) Resp:  [17-18] 18 (07/04 0542) BP: (146-168)/(83-93) 168/83 (07/04 0542) SpO2:  [97 %-100 %] 97 % (07/04 0542) Last BM Date : 06/28/23  Intake/Output from previous day: 07/03 0701 - 07/04 0700 In: 3423.4 [P.O.:780; I.V.:2248.7; NG/GT:60; IV Piggyback:334.7] Out: 920 [Urine:900; Emesis/NG output:20] Intake/Output this shift: No intake/output data recorded.  Abd - much less distended; active bowel sounds Incision c/d/I Appropriate incisional tenderness   Lab Results:  Recent Labs    06/29/23 0514  WBC 6.9  HGB 13.3  HCT 41.7  PLT 207   BMET Recent Labs    06/29/23 0514 06/30/23 0458  NA 137 135  K 3.8 3.6  CL 97* 97*  CO2 28 28  GLUCOSE 99 94  BUN 23 19  CREATININE 1.19* 1.10*  CALCIUM 8.3* 8.1*   PT/INR No results for input(s): "LABPROT", "INR" in the last 72 hours. ABG No results for input(s): "PHART", "HCO3" in the last 72 hours.  Invalid input(s): "PCO2", "PO2"  Studies/Results: DG Abd Portable 1V  Result Date: 06/29/2023 CLINICAL DATA:  Nasogastric tube placement EXAM: PORTABLE ABDOMEN - 1 VIEW COMPARISON:  06/27/2023 FINDINGS: The nasogastric tube extends into the decompressed stomach. A few distended small bowel loops are noted in the visualized upper abdomen. The lower abdomen is excluded. Linear subsegmental atelectasis or scarring at the left lung base as before. Midline skin staples over the abdomen. Aortic Atherosclerosis (ICD10-170.0). IMPRESSION: Nasogastric tube into decompressed stomach. Electronically Signed   By: [name] M.D.   On: 06/29/2023 15:29    Anti-infectives: Anti-infectives (From admission, onward)    Start     Dose/Rate Route  Frequency Ordered Stop   06/28/23 0915  ceFAZolin (ANCEF) IVPB 2g/100 mL premix        2 g 200 mL/hr over 30 Minutes Intravenous On call to O.R. 06/28/23 0911 06/28/23 1215       Assessment/Plan: POD 3 s/p Exploratory laparotomy, lysis of adhesions for SBO by Dr. Corliss Skains - 06/28/23 - Remove NG tube - Full liquids - Mobilize. - Pulm toilet     FEN - Full liquids VTE - SCDs, Lovenox  ID - Ancef peri-op. None currently. Afebrile. WBC wnl Foley - Out, voiding.  LOS: 4 days    Wynona Luna 07/01/2023

## 2023-07-02 LAB — CBC
HCT: 37.5 % (ref 36.0–46.0)
Hemoglobin: 11.7 g/dL — ABNORMAL LOW (ref 12.0–15.0)
MCH: 29.1 pg (ref 26.0–34.0)
MCHC: 31.2 g/dL (ref 30.0–36.0)
MCV: 93.3 fL (ref 80.0–100.0)
Platelets: 230 10*3/uL (ref 150–400)
RBC: 4.02 MIL/uL (ref 3.87–5.11)
RDW: 13.5 % (ref 11.5–15.5)
WBC: 6.2 10*3/uL (ref 4.0–10.5)
nRBC: 0 % (ref 0.0–0.2)

## 2023-07-02 LAB — BASIC METABOLIC PANEL
Anion gap: 9 (ref 5–15)
BUN: 11 mg/dL (ref 8–23)
CO2: 23 mmol/L (ref 22–32)
Calcium: 7.5 mg/dL — ABNORMAL LOW (ref 8.9–10.3)
Chloride: 102 mmol/L (ref 98–111)
Creatinine, Ser: 0.92 mg/dL (ref 0.44–1.00)
GFR, Estimated: >60 mL/min (ref >60)
Glucose, Bld: 107 mg/dL — ABNORMAL HIGH (ref 70–99)
Potassium: 3.1 mmol/L — ABNORMAL LOW (ref 3.5–5.1)
Sodium: 134 mmol/L — ABNORMAL LOW (ref 135–145)

## 2023-07-02 MED ORDER — METHOCARBAMOL 500 MG PO TABS: 500.0000 mg | ORAL_TABLET | Freq: Four times a day (QID) | ORAL | Status: DC | PRN

## 2023-07-02 MED ORDER — POLYETHYLENE GLYCOL 3350 17 G PO PACK
17.0000 g | PACK | Freq: Every day | ORAL | Status: DC
  Administered 2023-07-02 – 2023-07-03 (×2): 17 g via ORAL
  Filled 2023-07-02 (×2): qty 1

## 2023-07-02 MED ORDER — HYDRALAZINE HCL 20 MG/ML IJ SOLN
10.0000 mg | Freq: Three times a day (TID) | INTRAMUSCULAR | Status: DC | PRN
  Administered 2023-07-02 – 2023-07-04 (×2): 10 mg via INTRAVENOUS
  Filled 2023-07-02 (×2): qty 1

## 2023-07-02 MED ORDER — POTASSIUM CHLORIDE CRYS ER 20 MEQ PO TBCR
20.0000 meq | EXTENDED_RELEASE_TABLET | Freq: Two times a day (BID) | ORAL | Status: DC
  Administered 2023-07-02 – 2023-07-03 (×4): 20 meq via ORAL
  Filled 2023-07-02 (×5): qty 1

## 2023-07-02 MED ORDER — DOCUSATE SODIUM 100 MG PO CAPS
100.0000 mg | ORAL_CAPSULE | Freq: Two times a day (BID) | ORAL | Status: DC
  Administered 2023-07-02 – 2023-07-03 (×2): 100 mg via ORAL
  Filled 2023-07-02 (×4): qty 1

## 2023-07-02 NOTE — Progress Notes (Signed)
4 Days Post-Op   Subjective/Chief Complaint: Passing flatus Tolerating soft diet No BM yet   Objective: Vital signs in last 24 hours: Temp:  [97.6 F (36.4 C)-99.1 F (37.3 C)] 98.4 F (36.9 C) (07/05 0343) Pulse Rate:  [73-74] 73 (07/05 0343) Resp:  [15-18] 18 (07/05 0343) BP: (123-178)/(91-97) 178/97 (07/05 0343) SpO2:  [94 %-100 %] 100 % (07/05 0343) Weight:  [56.3 kg] 56.3 kg (07/05 0410) Last BM Date : 06/17/23  Intake/Output from previous day: 07/04 0701 - 07/05 0700 In: 1324.4 [P.O.:480; I.V.:517.9; IV Piggyback:326.5] Out: 1201 [Urine:1151; Emesis/NG output:50] Intake/Output this shift: Total I/O In: 120 [P.O.:120] Out: 600 [Urine:600]  Abd - soft, minimally distended Incision c/d/I Appropriate incisional tenderness    Lab Results:  Recent Labs    07/02/23 0423  WBC 6.2  HGB 11.7*  HCT 37.5  PLT 230   BMET Recent Labs    06/30/23 0458 07/02/23 0423  NA 135 134*  K 3.6 3.1*  CL 97* 102  CO2 28 23  GLUCOSE 94 107*  BUN 19 11  CREATININE 1.10* 0.92  CALCIUM 8.1* 7.5*   PT/INR No results for input(s): "LABPROT", "INR" in the last 72 hours. ABG No results for input(s): "PHART", "HCO3" in the last 72 hours.  Invalid input(s): "PCO2", "PO2"  Studies/Results: No results found.  Anti-infectives: Anti-infectives (From admission, onward)    Start     Dose/Rate Route Frequency Ordered Stop   06/28/23 0915  ceFAZolin (ANCEF) IVPB 2g/100 mL premix        2 g 200 mL/hr over 30 Minutes Intravenous On call to O.R. 06/28/23 0911 06/28/23 1215       Assessment/Plan: POD 4 s/p Exploratory laparotomy, lysis of adhesions for SBO by Dr. Corliss Skains - 06/28/23 - Soft diet - Mobilize. - Pulm toilet  -Miralax, Colace today - Possible discharge tomorrow if having bowel function - no home health needs   FEN - Soft diet VTE - SCDs, Lovenox  ID - Ancef peri-op. None currently. Afebrile. WBC wnl Foley - Out, voiding  LOS: 5 days    Wynona Luna 07/02/2023

## 2023-07-02 NOTE — Discharge Instructions (Signed)
[facility]      [facility] [phone]  OPEN ABDOMINAL SURGERY: POST OP INSTRUCTIONS  Always review your discharge instruction sheet given to you by the facility where your surgery was performed.  IF YOU HAVE DISABILITY OR FAMILY LEAVE FORMS, YOU MUST BRING THEM TO THE OFFICE FOR PROCESSING.  PLEASE DO NOT GIVE THEM TO YOUR DOCTOR.  A prescription for pain medication may be given to you upon discharge.  Take your pain medication as prescribed, if needed.  If narcotic pain medicine is not needed, then you may take acetaminophen (Tylenol) or ibuprofen (Advil) as needed. Take your usually prescribed medications unless otherwise directed. If you need a refill on your pain medication, please contact your pharmacy. They will contact our office to request authorization.  Prescriptions will not be filled after 5pm or on week-ends. You should follow a light diet the first few days after arrival home, such as soup and crackers, pudding, etc.unless your doctor has advised otherwise. A high-fiber, low fat diet can be resumed as tolerated.   Be sure to include lots of fluids daily. Most patients will experience some swelling and bruising on the chest and neck area.  Ice packs will help.  Swelling and bruising can take several days to resolve Most patients will experience some swelling and bruising in the area of the incision. Ice pack will help. Swelling and bruising can take several days to resolve..  It is common to experience some constipation if taking pain medication after surgery.  Increasing fluid intake and taking a stool softener will usually help or prevent this problem from occurring.  A mild laxative (Milk of Magnesia or Miralax) should be taken according to package directions if there are no bowel movements after 48 hours.  You may have steri-strips (small skin tapes) in place directly over the incision.  These strips should be left on the skin for 7-10 days.  If your surgeon used skin  glue on the incision, you may shower in 24 hours.  The glue will flake off over the next 2-3 weeks.  Any sutures or staples will be removed at the office during your follow-up visit. You may find that a light gauze bandage over your incision may keep your staples from being rubbed or pulled. You may shower and replace the bandage daily. ACTIVITIES:  You may resume regular (light) daily activities beginning the next day--such as daily self-care, walking, climbing stairs--gradually increasing activities as tolerated.  You may have sexual intercourse when it is comfortable.  Refrain from any heavy lifting or straining until approved by your doctor. You may drive when you no longer are taking prescription pain medication, you can comfortably wear a seatbelt, and you can safely maneuver your car and apply brakes Return to Work: ___________________________________ You should see your doctor in the office for a follow-up appointment approximately two weeks after your surgery.  Make sure that you call for this appointment within a day or two after you arrive home to insure a convenient appointment time. OTHER INSTRUCTIONS:  _____________________________________________________________ _____________________________________________________________  WHEN TO CALL YOUR DOCTOR: Fever over 101.0 Inability to urinate Nausea and/or vomiting Extreme swelling or bruising Continued bleeding from incision. Increased pain, redness, or drainage from the incision. Difficulty swallowing or breathing Muscle cramping or spasms. Numbness or tingling in hands or feet or around lips.  The clinic staff is available to answer your questions during regular business hours.  Please don't hesitate to call and ask to speak to one of   the nurses if you have concerns.  For further questions, please visit [email or web address]  

## 2023-07-03 NOTE — Progress Notes (Signed)
  5 Days Post-Op   Chief Complaint/Subjective: Tolerating diet, +BM, periincisional pain issues  Objective: Vital signs in last 24 hours: Temp:  [98.5 F (36.9 C)-99.2 F (37.3 C)] 98.7 F (37.1 C) (07/06 0613) Pulse Rate:  [74-78] 74 (07/06 0613) Resp:  [17-18] 17 (07/06 0613) BP: (157-185)/(93-102) 178/102 (07/06 0613) SpO2:  [98 %-100 %] 100 % (07/06 1610) Weight:  [55.5 kg] 55.5 kg (07/06 0500) Last BM Date : 07/02/23 Intake/Output from previous day: 07/05 0701 - 07/06 0700 In: 3353.1 [P.O.:840; I.V.:2513.1] Out: 1700 [Urine:1700]  PE: Gen: NAD Resp: nonlabored Card: RRR Abd: incision c/d/I, tender to palpation centrally  Lab Results:  Recent Labs    07/02/23 0423  WBC 6.2  HGB 11.7*  HCT 37.5  PLT 230   Recent Labs    07/02/23 0423  NA 134*  K 3.1*  CL 102  CO2 23  GLUCOSE 107*  BUN 11  CREATININE 0.92  CALCIUM 7.5*   No results for input(s): "LABPROT", "INR" in the last 72 hours.    Component Value Date/Time   NA 134 (L) 07/02/2023 0423   NA 143 02/08/2020 1029   K 3.1 (L) 07/02/2023 0423   CL 102 07/02/2023 0423   CO2 23 07/02/2023 0423   GLUCOSE 107 (H) 07/02/2023 0423   BUN 11 07/02/2023 0423   BUN 10 02/08/2020 1029   CREATININE 0.92 07/02/2023 0423   CALCIUM 7.5 (L) 07/02/2023 0423   PROT 5.6 (L) 06/29/2023 0514   PROT 7.1 02/08/2020 1029   ALBUMIN 2.6 (L) 06/29/2023 0514   ALBUMIN 4.4 02/08/2020 1029   AST 23 06/29/2023 0514   ALT 14 06/29/2023 0514   ALKPHOS 46 06/29/2023 0514   BILITOT 0.9 06/29/2023 0514   BILITOT 0.3 02/08/2020 1029   GFRNONAA >60 07/02/2023 0423   GFRAA 75 02/08/2020 1029    Assessment/Plan  s/p Procedure(s): EXPLORATORY LAPAROTOMY and LYSIS OF ADHESIONS 06/28/2023    FEN - soft diet VTE - lovenox ID - no issues Disposition - pain control, home soon   LOS: 6 days   I reviewed last 24 h vitals and pain scores, last 48 h intake and output, last 24 h labs and trends, and last 24 h imaging  results.  This care required high  level of medical decision making.   [facility] 07/03/2023, 7:51 AM Please see Amion for pager number during day hours 7:00am-4:30pm or 7:00am -11:30am on weekends

## 2023-07-04 MED ORDER — IBUPROFEN 800 MG PO TABS
800.0000 mg | ORAL_TABLET | Freq: Three times a day (TID) | ORAL | 0 refills | Status: AC | PRN
Start: 2023-07-04 — End: ?

## 2023-07-04 MED ORDER — OXYCODONE HCL 5 MG PO TABS
5.0000 mg | ORAL_TABLET | Freq: Four times a day (QID) | ORAL | 0 refills | Status: AC | PRN
Start: 2023-07-04 — End: ?

## 2023-07-04 NOTE — Discharge Summary (Signed)
Physician Discharge Summary  Deanna Harrell:[id number] DOB: 1956-05-18 DOA: 06/26/2023  PCP: Ellender Hose, NP  Admit date: 06/26/2023 Discharge date:  07/04/2023   Recommendations for Outpatient Follow-up:   (include homehealth, outpatient follow-up instructions, specific recommendations for PCP to follow-up on, etc.)   Follow-up Information     [facility] Follow up.   Specialty: General Harrell Why: 10 on 07/12/23. This is a nurse visit for staple removal. Please bring a copy of your photo ID, insurance card and arrive 30 minutes prior to your appointment for paperwork. Contact information: [address] [place] [address] [phone]        Deanna Rudd, MD Follow up on 07/22/2023.   Specialty: General Harrell Why: 07/22/23 3:50pm. Please bring a copy of your photo ID, insurance card and arrive 30 minutes prior to your appointment for paperwork. Contact information: [address] [place] [address] [phone]                Discharge Diagnoses:  Principal Problem:   SBO (small bowel obstruction) (HCC)   Surgical Procedure: lysis of adhesions  Discharge Condition: Good Disposition: Home  Diet recommendation: reg diet   Hospital Course:  67 yo female presented with small bowel obstruction. She underwent ex lap with lysis of adhesions of multiple perihepatic thick adhesions. Post op she did well and was advanced on diet. She had return of bowel function and was discharged home POD 6.  Discharge Instructions  Discharge Instructions     Call MD for:  difficulty breathing, headache or visual disturbances   Complete by: As directed    Call MD for:  persistant nausea and vomiting   Complete by: As directed    Call MD for:  redness, tenderness, or signs of infection (pain, swelling, redness, odor or green/yellow discharge around incision site)   Complete by: As directed    Call MD for:   severe uncontrolled pain   Complete by: As directed    Call MD for:  temperature >100.4   Complete by: As directed    Diet - low sodium heart healthy   Complete by: As directed    Discharge instructions   Complete by: As directed    You had pulmonary nodules on your imaging.  This might be infectious or inflammatory, but neoplasm (cancer) can't be ruled out.  You need a follow up CT scan within 3 months with your PCP.  Follow this and discuss outpatient with your PCP to arrange this follow up.   Discharge wound care:   Complete by: As directed    Shower normal tomorrow. Glue to stay on for 10-14 days. No bandage needed.   Increase activity slowly   Complete by: As directed       Allergies as of 07/04/2023   No Known Allergies      Medication List     TAKE these medications    amLODipine 2.5 MG tablet Commonly known as: NORVASC Take 1 tablet (2.5 mg total) by mouth daily.   bisacodyl 5 MG EC tablet Commonly known as: DULCOLAX Take 1 tablet (5 mg total) by mouth daily as needed for moderate constipation.   ibuprofen 800 MG tablet Commonly known as: ADVIL Take 1 tablet (800 mg total) by mouth every 8 (eight) hours as needed.   oxyCODONE 5 MG immediate release tablet Commonly known as: Oxy IR/ROXICODONE Take 1 tablet (5 mg total) by mouth every 6 (six) hours as  needed for severe pain.               Discharge Care Instructions  (From admission, onward)           Start     Ordered   07/04/23 0000  Discharge wound care:       Comments: Shower normal tomorrow. Glue to stay on for 10-14 days. No bandage needed.   07/04/23 0741            Follow-up Information     [facility] Follow up.   Specialty: General Harrell Why: 10 on 07/12/23. This is a nurse visit for staple removal. Please bring a copy of your photo ID, insurance card and arrive 30 minutes prior to your appointment for paperwork. Contact information: [address] [place] [address] [phone]        Deanna Rudd, MD Follow up on 07/22/2023.   Specialty: General Harrell Why: 07/22/23 3:50pm. Please bring a copy of your photo ID, insurance card and arrive 30 minutes prior to your appointment for paperwork. Contact information: [address] [place] [address] [phone]                  The results of significant diagnostics from this hospitalization (including imaging, microbiology, ancillary and laboratory) are listed below for reference.    Significant Diagnostic Studies: DG Abd Portable 1V  Result Date: 06/29/2023 CLINICAL DATA:  Nasogastric tube placement EXAM: PORTABLE ABDOMEN - 1 VIEW COMPARISON:  06/27/2023 FINDINGS: The nasogastric tube extends into the decompressed stomach. A few distended small bowel loops are noted in the visualized upper abdomen. The lower abdomen is excluded. Linear subsegmental atelectasis or scarring at the left lung base as before. Midline skin staples over the abdomen. Aortic Atherosclerosis (ICD10-170.0). IMPRESSION: Nasogastric tube into decompressed stomach. Electronically Signed   By: [name] M.D.   On: 06/29/2023 15:29   [place] EKG SITE RITE  Result Date: 06/28/2023 If Site Rite image not attached, placement could not be confirmed due to current cardiac rhythm.  DG Abd Portable 1V-Small Bowel Obstruction Protocol-initial, 8 hr delay  Result Date: 06/27/2023 CLINICAL DATA:  SBO protocol, 8 hour delayed image. EXAM: PORTABLE ABDOMEN - 1 VIEW COMPARISON:  CT abdomen pelvis 06/26/2023 FINDINGS: Numerous gas-filled dilated small bowel loops throughout the abdomen, unchanged compared to 06/26/2023. Enteric tube tip and side hole project over the expected location of the upper gastric body. Oral contrast is seen within the stomach and in the medial right upper abdomen, in the expected location of the proximal duodenum. Flocculation of the barium suspension  is noted. IMPRESSION: Oral contrast is seen within the stomach and proximal duodenum. Persistent dilated gas-filled loops of small bowel throughout the abdomen, unchanged compared to 06/26/2023. Electronically Signed   By: Sherron Ales M.D.   On: 06/27/2023 19:28   DG Chest Portable 1 View  Result Date: 06/27/2023 CLINICAL DATA:  67 year old female NG tube placement. Small-bowel obstruction by CT. EXAM: PORTABLE CHEST 1 VIEW COMPARISON:  Chest CT 06/16/2023. CT Abdomen and Pelvis yesterday. FINDINGS: Portable AP semi upright view at 0437 hours. Enteric tube placed into the stomach, side hole at the level of the gastric fundus. Platelike left lung base atelectasis. No other confluent lung opacity. Stable cardiac size and mediastinal contours. Numerous gas-filled bowel loops in the visible abdomen not significantly changed from the CT Abdomen and Pelvis yesterday. IMPRESSION: Satisfactory enteric tube placement into the stomach. Electronically  Signed   By: Odessa Fleming M.D.   On: 06/27/2023 04:54   CT ABDOMEN PELVIS W CONTRAST  Result Date: 06/27/2023 CLINICAL DATA:  Bowel obstruction, abdominal pain EXAM: CT ABDOMEN AND PELVIS WITH CONTRAST TECHNIQUE: Multidetector CT imaging of the abdomen and pelvis was performed using the standard protocol following bolus administration of intravenous contrast. RADIATION DOSE REDUCTION: This exam was performed according to the departmental dose-optimization program which includes automated exposure control, adjustment of the mA and/or kV according to patient size and/or use of iterative reconstruction technique. CONTRAST:  75mL OMNIPAQUE IOHEXOL 300 MG/ML  SOLN COMPARISON:  06/16/2023 FINDINGS: Lower chest: Elevation of the left hemidiaphragm again noted with mild left basilar atelectasis. Multiple nodular densities are again seen within the visualized right lung base, similar prior examination, but indeterminate. Cardiac size within normal limits. Hepatobiliary: No focal liver  abnormality is seen. No gallstones, gallbladder wall thickening, or biliary dilatation. Pancreas: Unremarkable Spleen: Unremarkable Adrenals/Urinary Tract: The adrenal glands are unremarkable. The kidneys are normal. The bladder is unremarkable. Stomach/Bowel: The small bowel is diffusely dilated and fluid-filled with transition to a completely decompressed terminal ileum within the right upper quadrant in keeping with a distal small bowel obstruction. The exact point of transition is not clearly identified, but suspected at axial image # 20/2 and sagittal image # 36/10 within the right upper quadrant anterior to the right hepatic lobe. The terminal ileum and colon are decompressed. The appendix is unremarkable. No free intraperitoneal gas or fluid. Vascular/Lymphatic: Fusiform infrarenal abdominal aortic aneurysm is present with the abdominal aorta measuring 3.2 cm in greatest dimension on coronal image # 71/8. Moderate circumferential mural thrombus within the aneurysm sac. No pathologic adenopathy within the abdomen and pelvis. Reproductive: Uterus and bilateral adnexa are unremarkable. Other: No abdominal wall hernia or abnormality. No abdominopelvic ascites. Musculoskeletal: No acute bone abnormality. No lytic or blastic bone lesion. IMPRESSION: 1. Distal small bowel obstruction with transition point within the right upper quadrant anterior to the right hepatic lobe. 2. 3.2 cm infrarenal abdominal aortic aneurysm. Recommend follow-up ultrasound every 3 years. 3. Multiple right basilar indeterminate pulmonary nodules measuring up to 14 mm better assessed on prior CT examination. As noted on that exam, short-term follow-up imaging in 3 months is recommended to assess for stability or resolution. Aortic Atherosclerosis (ICD10-I70.0). Electronically Signed   By: Helyn Numbers M.D.   On: 06/27/2023 00:25   CT Chest W Contrast  Result Date: 06/16/2023 CLINICAL DATA:  Abnormal x-ray, lung nodule. EXAM: CT CHEST  WITH CONTRAST TECHNIQUE: Multidetector CT imaging of the chest was performed during intravenous contrast administration. RADIATION DOSE REDUCTION: This exam was performed according to the departmental dose-optimization program which includes automated exposure control, adjustment of the mA and/or kV according to patient size and/or use of iterative reconstruction technique. CONTRAST:  60mL OMNIPAQUE IOHEXOL 300 MG/ML  SOLN COMPARISON:  06/16/2023. FINDINGS: Cardiovascular: The heart is enlarged and there is a trace pericardial effusion. Scattered coronary artery calcifications are noted. There is atherosclerotic calcification of the aorta without evidence of aneurysm. The pulmonary trunk is normal in caliber. Mediastinum/Nodes: No mediastinal, hilar, or axillary lymphadenopathy. The thyroid gland and esophagus are within normal limits. There is a 6 mm nodular density in the distal trachea. Lungs/Pleura: Centrilobular emphysematous changes are present in the lungs. Bronchiectasis with atelectasis or scarring is noted in the lower lobes bilaterally. Scattered nodular and irregular opacities are noted in the right lung measuring up to 1.3 cm in the right lower lobe, axial image  121. No effusion or pneumothorax. Upper Abdomen: No acute abnormality. Musculoskeletal: No acute osseous abnormality. IMPRESSION: 1. Scattered nodular and irregular opacities in the right lung which may be infectious or inflammatory. The possibility of underlying neoplasm can not be completely excluded. Three-month follow-up chest CT is recommended. 2. Bronchiectasis with atelectasis or scarring in the lower lobes bilaterally. 3. Centrilobular emphysema. 4. Coronary artery calcifications. 5. Aortic atherosclerosis. Electronically Signed   By: Thornell Sartorius M.D.   On: 06/16/2023 02:14   CT ABDOMEN PELVIS W CONTRAST  Result Date: 06/16/2023 CLINICAL DATA:  Abdominal pain. EXAM: CT ABDOMEN AND PELVIS WITH CONTRAST TECHNIQUE: Multidetector CT  imaging of the abdomen and pelvis was performed using the standard protocol following bolus administration of intravenous contrast. RADIATION DOSE REDUCTION: This exam was performed according to the departmental dose-optimization program which includes automated exposure control, adjustment of the mA and/or kV according to patient size and/or use of iterative reconstruction technique. CONTRAST:  75mL OMNIPAQUE IOHEXOL 300 MG/ML  SOLN COMPARISON:  None Available. FINDINGS: Lower chest: Indeterminate somewhat plaque-like nodules at the right lung base measuring up to 14 mm in the right lower lobe along the diaphragm. A 6 mm nodule in the right middle lobe. There are bibasilar linear scarring. Dedicated chest CT is recommended for better evaluation. No intra-abdominal free air or free fluid. Hepatobiliary: The liver is unremarkable. No biliary dilatation. The gallbladder is unremarkable. Pancreas: Unremarkable. No pancreatic ductal dilatation or surrounding inflammatory changes. Spleen: Normal in size without focal abnormality. Adrenals/Urinary Tract: The adrenal glands are unremarkable. There is no hydronephrosis on either side. There is symmetric enhancement and excretion of contrast by both kidneys. Left renal parapelvic cysts. The urinary bladder is collapsed. Stomach/Bowel: Mildly dilated loops of small bowel in the right hemiabdomen without a discrete transition. Evaluation however is limited in the absence of oral contrast. Findings likely represent ileus. Developing obstruction is less likely. Clinical correlation is recommended. A small-bowel series may provide additional information if there is high clinical concern for bowel obstruction. The appendix is unremarkable as visualized. Vascular/Lymphatic: There is a 3 cm partially thrombosed fusiform infrarenal abdominal aortic aneurysm. There is moderate aortoiliac atherosclerotic disease. The IVC is unremarkable. No portal venous gas. There is no adenopathy.  Reproductive: The uterus is suboptimally visualized and evaluated. Small uterine fibroids may be present. No adnexal masses. Other: Small fat containing umbilical hernia. Musculoskeletal: No acute or significant osseous findings. IMPRESSION: 1. Probable mild ileus. The limping obstruction is less likely. Clinical correlation is recommended. 2. A 3 cm partially thrombosed fusiform infrarenal abdominal aortic aneurysm. Recommend follow-up ultrasound every 3 years. This recommendation follows ACR consensus guidelines: White Paper of the ACR Incidental Findings Committee II on Vascular Findings. J Am Coll Radiol 2013; 10:789-794. 3. Indeterminate right lung base nodules. Dedicated chest CT is recommended for better evaluation. 4.  Aortic Atherosclerosis (ICD10-I70.0). Electronically Signed   By: Elgie Collard M.D.   On: 06/16/2023 00:40    Labs: Basic Metabolic Panel: Recent Labs  Lab 06/28/23 0413 06/29/23 0514 06/30/23 0458 07/02/23 0423  NA 139 137 135 134*  K 3.7 3.8 3.6 3.1*  CL 98 97* 97* 102  CO2 27 28 28 23   GLUCOSE 85 99 94 107*  BUN 33* 23 19 11   CREATININE 1.20* 1.19* 1.10* 0.92  CALCIUM 8.7* 8.3* 8.1* 7.5*  MG 2.5* 2.0  --   --   PHOS 3.1 3.3  --   --    Liver Function Tests: Recent Labs  Lab 06/28/23  0413 06/29/23 0514  AST 20 23  ALT 17 14  ALKPHOS 51 46  BILITOT 1.0 0.9  PROT 6.1* 5.6*  ALBUMIN 3.1* 2.6*    CBC: Recent Labs  Lab 06/28/23 0413 06/29/23 0514 07/02/23 0423  WBC 6.4 6.9 6.2  NEUTROABS 4.1  --   --   HGB 13.5 13.3 11.7*  HCT 43.0 41.7 37.5  MCV 92.9 91.6 93.3  PLT 193 207 230    CBG: No results for input(s): "GLUCAP" in the last 168 hours.  Principal Problem:   SBO (small bowel obstruction) (HCC)   Time coordinating discharge: 15 min

## 2023-07-04 NOTE — Progress Notes (Signed)
Reviewed written d/c instructions w pt and all questions answered. She verbalized understanding. D/C via w/c w all belongings in stable condition. 

## 2023-07-05 ENCOUNTER — Telehealth: Payer: Self-pay

## 2023-07-05 NOTE — Transitions of Care (Post Inpatient/ED Visit) (Signed)
   07/05/2023  Name: Rollie Trefry MRN: [id number] DOB: 01/15/1956  Today's TOC FU Call Status: Today's TOC FU Call Status:: Successful TOC FU Call Competed TOC FU Call Complete Date: 07/05/23  Transition Care Management Follow-up Telephone Call Date of Discharge: 07/04/23 Discharge Facility: [facility]) Type of Discharge: Inpatient Admission Primary Inpatient Discharge Diagnosis:: SBO (small bowel obstruction) How have you been since you were released from the hospital?: Better Any questions or concerns?: No  Items Reviewed: Did you receive and understand the discharge instructions provided?: Yes Medications obtained,verified, and reconciled?: Yes (Medications Reviewed) Any new allergies since your discharge?: No Dietary orders reviewed?: Yes Do you have support at home?: Yes  Medications Reviewed Today: Medications Reviewed Today     Reviewed by Merleen Nicely, LPN (Licensed Practical Nurse) on 07/05/23 at 1256  Med List Status: <None>   Medication Order Taking? Sig Documenting Provider Last Dose Status Informant  amLODipine (NORVASC) 2.5 MG tablet [id number] Yes Take 1 tablet (2.5 mg total) by mouth daily. Garlon Hatchet, PA-C Taking Active Self, Pharmacy Records  bisacodyl (DULCOLAX) 5 MG EC tablet [id number] Yes Take 1 tablet (5 mg total) by mouth daily as needed for moderate constipation. Ellender Hose, NP Taking Active Self, Pharmacy Records  ibuprofen (ADVIL) 800 MG tablet [id number] Yes Take 1 tablet (800 mg total) by mouth every 8 (eight) hours as needed. Kinsinger, De Blanch, MD Taking Active   oxyCODONE (OXY IR/ROXICODONE) 5 MG immediate release tablet [id number] Yes Take 1 tablet (5 mg total) by mouth every 6 (six) hours as needed for severe pain. Kinsinger, De Blanch, MD Taking Active             Home Care and Equipment/Supplies: Were Home Health Services Ordered?: No Any new equipment or medical supplies ordered?: No  Functional Questionnaire: Do you  need assistance with bathing/showering or dressing?: No Do you need assistance with meal preparation?: No Do you need assistance with eating?: No Do you have difficulty maintaining continence: No Do you need assistance with getting out of bed/getting out of a chair/moving?: No Do you have difficulty managing or taking your medications?: No  Follow up appointments reviewed: PCP Follow-up appointment confirmed?: No (pt states she is changing PCP) MD Provider Line Number:[phone] Given: Yes Specialist Hospital Follow-up appointment confirmed?: Yes Date of Specialist follow-up appointment?: 07/22/23 Follow-Up Specialty Provider:: Dr Corliss Skains Do you need transportation to your follow-up appointment?: No Do you understand care options if your condition(s) worsen?: Yes-patient verbalized understanding    SIGNATURE  Woodfin Ganja LPN [facility] Nurse Health Advisor Direct Dial [phone]

## 2023-07-22 ENCOUNTER — Ambulatory Visit (INDEPENDENT_AMBULATORY_CARE_PROVIDER_SITE_OTHER): Payer: 59 | Admitting: Family Medicine

## 2023-07-22 ENCOUNTER — Encounter: Payer: Self-pay | Admitting: Family Medicine

## 2023-07-22 VITALS — BP 124/80 | HR 72 | Temp 98.4°F | Ht 67.0 in | Wt 114.0 lb

## 2023-07-22 DIAGNOSIS — K5900 Constipation, unspecified: Secondary | ICD-10-CM | POA: Diagnosis not present

## 2023-07-22 DIAGNOSIS — I10 Essential (primary) hypertension: Secondary | ICD-10-CM

## 2023-07-22 DIAGNOSIS — K56609 Unspecified intestinal obstruction, unspecified as to partial versus complete obstruction: Secondary | ICD-10-CM

## 2023-07-22 MED ORDER — AMLODIPINE BESYLATE 2.5 MG PO TABS
2.5000 mg | ORAL_TABLET | Freq: Every day | ORAL | 3 refills | Status: AC
Start: 2023-07-22 — End: ?

## 2023-07-22 MED ORDER — BISACODYL 5 MG PO TBEC
5.0000 mg | DELAYED_RELEASE_TABLET | Freq: Every day | ORAL | 3 refills | Status: AC | PRN
Start: 2023-07-22 — End: ?

## 2023-07-22 NOTE — Progress Notes (Signed)
I,Jameka J Llittleton, CMA,acting as a Neurosurgeon for [employer], NP.,have documented all relevant documentation on the behalf of Janeen Watson, NP,as directed by  Ranald Alessio Moshe Salisbury, NP while in the presence of Tationa Stech, NP.  Subjective:  Patient ID: Deanna Harrell , female    DOB: 02-Jul-1956 , 67 y.o.   MRN: [id number]  Chief Complaint  Patient presents with   HOSPITAL F/U    HPI  Patient presents today for a hospital follow up.  Patient was admitted in the hospital on 06/27/2023 and had surgery on 06/28/2023 for small bowel obstruction repair surgery and was discharged on 07/04/2023. Patient is in the office today and reports she is feeling much better, able to eat, has regular bowel movements etc        Past Medical History:  Diagnosis Date   Hypertension    Substance abuse (HCC)    Tobacco abuse      Family History  Problem Relation Age of Onset   Diabetes Mother    Hypertension Mother    Stroke Mother    Hypertension Sister    Hypertension Brother    Colon cancer Neg Hx    Colon polyps Neg Hx    Stomach cancer Neg Hx    Rectal cancer Neg Hx    Esophageal cancer Neg Hx      Current Outpatient Medications:    oxyCODONE (OXY IR/ROXICODONE) 5 MG immediate release tablet, Take 1 tablet (5 mg total) by mouth every 6 (six) hours as needed for severe pain., Disp: 15 tablet, Rfl: 0   amLODipine (NORVASC) 2.5 MG tablet, Take 1 tablet (2.5 mg total) by mouth daily., Disp: 90 tablet, Rfl: 3   bisacodyl (DULCOLAX) 5 MG EC tablet, Take 1 tablet (5 mg total) by mouth daily as needed for moderate constipation., Disp: 90 tablet, Rfl: 3   ibuprofen (ADVIL) 800 MG tablet, Take 1 tablet (800 mg total) by mouth every 8 (eight) hours as needed. (Patient not taking: Reported on 07/22/2023), Disp: 30 tablet, Rfl: 0   No Known Allergies   Review of Systems  Constitutional: Negative.   Eyes: Negative.   Respiratory: Negative.    Gastrointestinal:  Negative for abdominal distention, abdominal pain,  constipation, nausea and vomiting.  Genitourinary: Negative.   Musculoskeletal: Negative.   Skin: Negative.   Psychiatric/Behavioral: Negative.       Today's Vitals   07/22/23 0950  BP: 124/80  Pulse: 72  Temp: 98.4 F (36.9 C)  Weight: 114 lb (51.7 kg)  Height: 5\' 7"  (1.702 m)  PainSc: 0-No pain   Body mass index is 17.85 kg/m.  Wt Readings from Last 3 Encounters:  07/22/23 114 lb (51.7 kg)  07/04/23 129 lb 3.2 oz (58.6 kg)  06/22/23 110 lb 6.4 oz (50.1 kg)     Objective:  Physical Exam Cardiovascular:     Rate and Rhythm: Normal rate and regular rhythm.  Abdominal:     General: Abdomen is flat. Bowel sounds are normal.     Palpations: Abdomen is soft.     Comments: Incision site looks clean,dry. No signs of infection noted  Neurological:     Mental Status: She is alert.  Psychiatric:        Mood and Affect: Mood normal.         Assessment And Plan:  SBO (small bowel obstruction) (HCC) Assessment & Plan: Resolved. Had surgery on 06/28/2023   Constipation, unspecified constipation type -     Bisacodyl; Take 1 tablet (5 mg  total) by mouth daily as needed for moderate constipation.  Dispense: 90 tablet; Refill: 3  Primary hypertension Assessment & Plan: BP well controlled, continue current tx regimen.  Orders: -     amLODIPine Besylate; Take 1 tablet (2.5 mg total) by mouth daily.  Dispense: 90 tablet; Refill: 3     Return if symptoms worsen or fail to improve, for keep appt in september.  Patient was given opportunity to ask questions. Patient verbalized understanding of the plan and was able to repeat key elements of the plan. All questions were answered to their satisfaction.  Burleigh Brockmann Moshe Salisbury, NP  I, Marcellius Montagna Moshe Salisbury, NP, have reviewed all documentation for this visit. The documentation on 07/28/23 for the exam, diagnosis, procedures, and orders are all accurate and complete.   IF YOU HAVE BEEN REFERRED TO A SPECIALIST, IT MAY TAKE 1-2 WEEKS TO SCHEDULE/PROCESS  THE REFERRAL. IF YOU HAVE NOT HEARD FROM US/SPECIALIST IN TWO WEEKS, PLEASE GIVE [place] A CALL AT [phone].   THE PATIENT IS ENCOURAGED TO PRACTICE SOCIAL DISTANCING DUE TO THE COVID-19 PANDEMIC.

## 2023-07-22 NOTE — Assessment & Plan Note (Signed)
BP well controlled, continue current tx regimen.

## 2023-07-22 NOTE — Assessment & Plan Note (Signed)
Resolved. Had surgery on 06/28/2023

## 2023-08-24 ENCOUNTER — Ambulatory Visit (INDEPENDENT_AMBULATORY_CARE_PROVIDER_SITE_OTHER): Payer: 59 | Admitting: Podiatry

## 2023-08-24 VITALS — BP 146/86

## 2023-08-24 DIAGNOSIS — M79675 Pain in left toe(s): Secondary | ICD-10-CM

## 2023-08-24 DIAGNOSIS — L84 Corns and callosities: Secondary | ICD-10-CM

## 2023-08-24 DIAGNOSIS — I739 Peripheral vascular disease, unspecified: Secondary | ICD-10-CM | POA: Diagnosis not present

## 2023-08-24 DIAGNOSIS — B351 Tinea unguium: Secondary | ICD-10-CM

## 2023-08-24 DIAGNOSIS — M79674 Pain in right toe(s): Secondary | ICD-10-CM | POA: Diagnosis not present

## 2023-08-24 NOTE — Progress Notes (Unsigned)
    Subjective:  Patient ID: Deanna Harrell, female    DOB: 04-20-1956,  MRN: [id number]  Deanna Harrell presents to clinic today for:  Chief Complaint  Patient presents with   Nail Problem    RFC-new pt-toenail fungus;routine foot and nail care-non req-Satacey Carpenter, NP refer   Callouses    B/L feet trim  . Patient notes nails are thick and elongated, causing pain in shoe gear when ambulating.  She has painful calluses, with the right foot being worse than the left.    PCP is Ellender Hose, NP.  Past Medical History:  Diagnosis Date   Hypertension    Substance abuse (HCC)    Tobacco abuse     No Known Allergies  Review of Systems: Negative except as noted in the HPI.  Objective:  Vitals:   08/24/23 1103  BP: (!) 146/86    Deanna Harrell is a pleasant 67 y.o. female in NAD. AAO x 3.  Vascular Examination: Patient has palpable DP pulse, absent PT pulse bilateral.  Delayed capillary refill bilateral toes.  Sparse digital hair bilateral.  Proximal to distal cooling WNL bilateral.    Dermatological Examination: Interspaces are clear with no open lesions noted bilateral.  Skin is shiny and atrophic bilateral.  Nails are 3-62mm thick, with yellowish/brown discoloration, subungual debris and distal onycholysis x10.  There is pain with compression of nails x10.  There are hyperkeratotic lesions noted submet 1 & 5 left foot, with significant callus formation submet 5 right foot.  .  Neurological Examination: Epicritic sensation intact b/l LE.   Musculoskeletal Examination: Muscle strength 5/5 to all LE muscle groups b/l.   Patient qualifies for at-risk foot care because of PVD .  Assessment/Plan: 1. Pain due to onychomycosis of toenails of both feet   2. Callus of foot   3. PVD (peripheral vascular disease) (HCC)    Mycotic nails x10 were sharply debrided with sterile nail nippers and power debriding burr to decrease bulk and length.  Hyperkeratotic lesions x3 were shaved  with #312 blade.  Her insole was off-loaded to get pressure off the right 5th met. head.    Return in about 3 months (around 11/24/2023) for RFC.   Clerance Lav, DPM, FACFAS [facility]     [address] [place], [place] [address]                Office [phone]  Fax [phone]

## 2023-09-07 NOTE — Progress Notes (Unsigned)
Madelaine Bhat, CMA,acting as a Neurosurgeon for Arnette Felts, FNP.,have documented all relevant documentation on the behalf of Arnette Felts, FNP,as directed by  Arnette Felts, FNP while in the presence of Arnette Felts, FNP.  Subjective:  Patient ID: Deanna Harrell , female    DOB: 1956/08/27 , 67 y.o.   MRN: [id number]  No chief complaint on file.   HPI  Patient presents today for bp follow up, Patient reports compliance with medications. Patient denies any Chest pain, SOB, and headaches. Patient has no concerns today.      Past Medical History:  Diagnosis Date   Hypertension    Substance abuse (HCC)    Tobacco abuse      Family History  Problem Relation Age of Onset   Diabetes Mother    Hypertension Mother    Stroke Mother    Hypertension Sister    Hypertension Brother    Colon cancer Neg Hx    Colon polyps Neg Hx    Stomach cancer Neg Hx    Rectal cancer Neg Hx    Esophageal cancer Neg Hx      Current Outpatient Medications:    amLODipine (NORVASC) 2.5 MG tablet, Take 1 tablet (2.5 mg total) by mouth daily., Disp: 90 tablet, Rfl: 3   bisacodyl (DULCOLAX) 5 MG EC tablet, Take 1 tablet (5 mg total) by mouth daily as needed for moderate constipation., Disp: 90 tablet, Rfl: 3   ibuprofen (ADVIL) 800 MG tablet, Take 1 tablet (800 mg total) by mouth every 8 (eight) hours as needed., Disp: 30 tablet, Rfl: 0   oxyCODONE (OXY IR/ROXICODONE) 5 MG immediate release tablet, Take 1 tablet (5 mg total) by mouth every 6 (six) hours as needed for severe pain., Disp: 15 tablet, Rfl: 0   No Known Allergies   Review of Systems   There were no vitals filed for this visit. There is no height or weight on file to calculate BMI.  Wt Readings from Last 3 Encounters:  07/22/23 114 lb (51.7 kg)  07/04/23 129 lb 3.2 oz (58.6 kg)  06/22/23 110 lb 6.4 oz (50.1 kg)    The ASCVD Risk score (Arnett DK, et al., 2019) failed to calculate for the following reasons:   Cannot find a previous HDL lab    Cannot find a previous total cholesterol lab  Objective:  Physical Exam      Assessment And Plan:  Primary hypertension    No follow-ups on file.  Patient was given opportunity to ask questions. Patient verbalized understanding of the plan and was able to repeat key elements of the plan. All questions were answered to their satisfaction.    Jeanell Sparrow, FNP, have reviewed all documentation for this visit. The documentation on 09/07/23 for the exam, diagnosis, procedures, and orders are all accurate and complete.   IF YOU HAVE BEEN REFERRED TO A SPECIALIST, IT MAY TAKE 1-2 WEEKS TO SCHEDULE/PROCESS THE REFERRAL. IF YOU HAVE NOT HEARD FROM US/SPECIALIST IN TWO WEEKS, PLEASE GIVE [place] A CALL AT [phone].

## 2023-09-09 ENCOUNTER — Ambulatory Visit: Payer: Self-pay | Admitting: Nurse Practitioner

## 2023-09-09 DIAGNOSIS — I10 Essential (primary) hypertension: Secondary | ICD-10-CM

## 2023-11-03 ENCOUNTER — Ambulatory Visit (INDEPENDENT_AMBULATORY_CARE_PROVIDER_SITE_OTHER): Payer: 59

## 2023-11-03 DIAGNOSIS — Z0000 Encounter for general adult medical examination without abnormal findings: Secondary | ICD-10-CM

## 2023-11-03 NOTE — Patient Instructions (Addendum)
Deanna Harrell , Thank you for taking time to come for your Medicare Wellness Visit. I appreciate your ongoing commitment to your health goals. Please review the following plan we discussed and let me know if I can assist you in the future.   Referrals/Orders/Follow-Ups/Clinician Recommendations: none  This is a list of the screening recommended for you and due dates:  Health Maintenance  Topic Date Due   DTaP/Tdap/Td vaccine (1 - Tdap) Never done   DEXA scan (bone density measurement)  Never done   Mammogram  03/19/2022   COVID-19 Vaccine (3 - 2023-24 season) 08/29/2023   Zoster (Shingles) Vaccine (1 of 2) 02/03/2024*   Flu Shot  03/27/2024*   Pneumonia Vaccine (1 of 2 - PCV) 11/02/2024*   Medicare Annual Wellness Visit  11/02/2024   Colon Cancer Screening  04/16/2027   Hepatitis C Screening  Completed   HPV Vaccine  Aged Out  *Topic was postponed. The date shown is not the original due date.    Advanced directives: (Declined) Advance directive discussed with you today. Even though you declined this today, please call our office should you change your mind, and we can give you the proper paperwork for you to fill out.  Next Medicare Annual Wellness Visit scheduled for next year: Yes  Insert Preventive Care attachment Insert FALL PREVENTION attachment if needed

## 2023-11-03 NOTE — Progress Notes (Signed)
Subjective:   Janis Sol is a 67 y.o. female who presents for Medicare Annual (Subsequent) preventive examination.  Visit Complete: Virtual I connected with  Norma Fredrickson on 11/03/23 by a audio enabled telemedicine application and verified that I am speaking with the correct person using two identifiers.  Patient Location: Home  Provider Location: Office/Clinic  I discussed the limitations of evaluation and management by telemedicine. The patient expressed understanding and agreed to proceed.  Vital Signs: Because this visit was a virtual/telehealth visit, some criteria may be missing or patient reported. Any vitals not documented were not able to be obtained and vitals that have been documented are patient reported.    Cardiac Risk Factors include: advanced age (>60men, >1 women);hypertension     Objective:    Today's Vitals   There is no height or weight on file to calculate BMI.     11/03/2023   10:31 AM 06/26/2023    9:48 PM 09/02/2022   12:05 PM 08/27/2021    9:51 AM 06/26/2020   12:43 PM 10/19/2018    3:36 PM 01/24/2018   10:28 AM  Advanced Directives  Does Patient Have a Medical Advance Directive? No No No No No No No  Would patient like information on creating a medical advance directive?  No - Patient declined No - Patient declined  No - Patient declined No - Patient declined No - Patient declined    Current Medications (verified) Outpatient Encounter Medications as of 11/03/2023  Medication Sig   amLODipine (NORVASC) 2.5 MG tablet Take 1 tablet (2.5 mg total) by mouth daily.   bisacodyl (DULCOLAX) 5 MG EC tablet Take 1 tablet (5 mg total) by mouth daily as needed for moderate constipation.   ibuprofen (ADVIL) 800 MG tablet Take 1 tablet (800 mg total) by mouth every 8 (eight) hours as needed. (Patient not taking: Reported on 11/03/2023)   oxyCODONE (OXY IR/ROXICODONE) 5 MG immediate release tablet Take 1 tablet (5 mg total) by mouth every 6 (six) hours as needed  for severe pain. (Patient not taking: Reported on 11/03/2023)   No facility-administered encounter medications on file as of 11/03/2023.    Allergies (verified) Patient has no known allergies.   History: Past Medical History:  Diagnosis Date   Hypertension    Substance abuse (HCC)    Tobacco abuse    Past Surgical History:  Procedure Laterality Date   LAPAROTOMY N/A 06/28/2023   Procedure: EXPLORATORY LAPAROTOMY and LYSIS OF ADHESIONS;  Surgeon: [name], MD;  Location: [facility];  Service: General;  Laterality: N/A;   Family History  Problem Relation Age of Onset   Diabetes Mother    Hypertension Mother    Stroke Mother    Hypertension Sister    Hypertension Brother    Colon cancer Neg Hx    Colon polyps Neg Hx    Stomach cancer Neg Hx    Rectal cancer Neg Hx    Esophageal cancer Neg Hx    Social History   Socioeconomic History   Marital status: Single    Spouse name: Not on file   Number of children: 2   Years of education: Not on file   Highest education level: Not on file  Occupational History   Occupation: disability  Tobacco Use   Smoking status: Every Day    Current packs/day: 0.25    Average packs/day: 0.3 packs/day for 47.0 years (11.8 ttl pk-yrs)    Types: Cigarettes   Smokeless tobacco: Never   Tobacco  comments:    she smokes 1-2 cigarettes   Vaping Use   Vaping status: Former  Substance and Sexual Activity   Alcohol use: Not Currently    Comment: Recovered alcoholic   Drug use: Yes    Frequency: 2.0 times per week    Types: Marijuana    Comment: LAST SMOKRED 04/14/20   Sexual activity: Not Currently  Other Topics Concern   Not on file  Social History Narrative   Not on file   Social Determinants of Health   Financial Resource Strain: Low Risk  (11/03/2023)   Overall Financial Resource Strain (CARDIA)    Difficulty of Paying Living Expenses: Not hard at all  Food Insecurity: No Food Insecurity (11/03/2023)   Hunger Vital Sign    Worried  About Running Out of Food in the Last Year: Never true    Ran Out of Food in the Last Year: Never true  Transportation Needs: No Transportation Needs (11/03/2023)   PRAPARE - Administrator, Civil Service (Medical): No    Lack of Transportation (Non-Medical): No  Physical Activity: Inactive (11/03/2023)   Exercise Vital Sign    Days of Exercise per Week: 0 days    Minutes of Exercise per Session: 0 min  Stress: No Stress Concern Present (11/03/2023)   [employer] of Occupational Health - Occupational Stress Questionnaire    Feeling of Stress : Not at all  Social Connections: Socially Isolated (11/03/2023)   Social Connection and Isolation Panel [NHANES]    Frequency of Communication with Friends and Family: More than three times a week    Frequency of Social Gatherings with Friends and Family: Once a week    Attends Religious Services: Never    Database administrator or Organizations: No    Attends Engineer, structural: Never    Marital Status: Never married    Tobacco Counseling Ready to quit: Yes Counseling given: Not Answered Tobacco comments: she smokes 1-2 cigarettes    Clinical Intake:  Pre-visit preparation completed: Yes  Pain : No/denies pain     Nutritional Risks: None Diabetes: No  How often do you need to have someone help you when you read instructions, pamphlets, or other written materials from your doctor or pharmacy?: 1 - Never  Interpreter Needed?: No  Information entered by :: NAllen LPN   Activities of Daily Living    11/03/2023   10:27 AM 06/27/2023    8:27 AM  In your present state of health, do you have any difficulty performing the following activities:  Hearing? 0 0  Vision? 1 0  Comment blurry at times   Difficulty concentrating or making decisions? 0 0  Walking or climbing stairs? 0 0  Dressing or bathing? 0 0  Doing errands, shopping? 0 0  Preparing Food and eating ? N   Using the Toilet? N   In the past six  months, have you accidently leaked urine? N   Do you have problems with loss of bowel control? N   Managing your Medications? N   Managing your Finances? N   Housekeeping or managing your Housekeeping? N     Patient Care Team: Ellender Hose, NP as PCP - General (Family Medicine)  Indicate any recent Medical Services you may have received from other than [facility] providers in the past year (date may be approximate).     Assessment:   This is a routine wellness examination for Syan.  Hearing/Vision screen Hearing Screening - Comments::  Denies hearing issues Vision Screening - Comments:: No regular eye exams   Goals Addressed             This Visit's Progress    Patient Stated       11/03/2023, denies goals       Depression Screen    11/03/2023   10:32 AM 09/02/2022   12:06 PM 08/27/2021    9:51 AM 06/26/2020   12:44 PM 02/08/2020    9:28 AM 12/29/2018    2:38 PM 10/19/2018    3:36 PM  PHQ 2/9 Scores  PHQ - 2 Score 0 1 0 0 1 0 0  PHQ- 9 Score       9    Fall Risk    11/03/2023   10:32 AM 09/02/2022   12:06 PM 08/27/2021    9:51 AM 06/26/2020   12:43 PM 02/08/2020    9:28 AM  Fall Risk   Falls in the past year? 0 0 0 0 0  Number falls in past yr: 0 0     Injury with Fall? 0 0     Risk for fall due to : Medication side effect No Fall Risks No Fall Risks Medication side effect   Follow up Falls prevention discussed;Falls evaluation completed Falls prevention discussed;Education provided;Falls evaluation completed Falls evaluation completed;Education provided;Falls prevention discussed Falls evaluation completed;Education provided;Falls prevention discussed     MEDICARE RISK AT HOME: Medicare Risk at Home Any stairs in or around the home?: No If so, are there any without handrails?: No Home free of loose throw rugs in walkways, pet beds, electrical cords, etc?: Yes Adequate lighting in your home to reduce risk of falls?: Yes Life alert?: No Use of a cane, walker or w/c?:  No Grab bars in the bathroom?: No Shower chair or bench in shower?: No Elevated toilet seat or a handicapped toilet?: No  TIMED UP AND GO:  Was the test performed?  No    Cognitive Function:        11/03/2023   10:32 AM 09/02/2022   12:08 PM 08/27/2021    9:52 AM 06/26/2020   12:45 PM 10/19/2018    3:43 PM  6CIT Screen  What Year? 0 points 4 points 0 points 0 points 0 points  What month? 0 points 3 points 0 points 0 points 0 points  What time? 3 points 0 points 0 points 3 points 0 points  Count back from 20 0 points 0 points 0 points 0 points 0 points  Months in reverse 4 points 4 points 4 points 4 points 0 points  Repeat phrase 0 points 4 points 8 points 2 points 0 points  Total Score 7 points 15 points 12 points 9 points 0 points    Immunizations Immunization History  Administered Date(s) Administered   PFIZER(Purple Top)SARS-COV-2 Vaccination 01/23/2021, 02/13/2021    TDAP status: Due, Education has been provided regarding the importance of this vaccine. Advised may receive this vaccine at local pharmacy or Health Dept. Aware to provide a copy of the vaccination record if obtained from local pharmacy or Health Dept. Verbalized acceptance and understanding.  Flu Vaccine status: Declined, Education has been provided regarding the importance of this vaccine but patient still declined. Advised may receive this vaccine at local pharmacy or Health Dept. Aware to provide a copy of the vaccination record if obtained from local pharmacy or Health Dept. Verbalized acceptance and understanding.  Pneumococcal vaccine status: Declined,  Education has been provided regarding the importance  of this vaccine but patient still declined. Advised may receive this vaccine at local pharmacy or Health Dept. Aware to provide a copy of the vaccination record if obtained from local pharmacy or Health Dept. Verbalized acceptance and understanding.   Covid-19 vaccine status: Information provided on how to  obtain vaccines.   Qualifies for Shingles Vaccine? Yes   Zostavax completed No   Shingrix Completed?: No.    Education has been provided regarding the importance of this vaccine. Patient has been advised to call insurance company to determine out of pocket expense if they have not yet received this vaccine. Advised may also receive vaccine at local pharmacy or Health Dept. Verbalized acceptance and understanding.  Screening Tests Health Maintenance  Topic Date Due   DTaP/Tdap/Td (1 - Tdap) Never done   DEXA SCAN  Never done   MAMMOGRAM  03/19/2022   COVID-19 Vaccine (3 - 2023-24 season) 08/29/2023   Zoster Vaccines- Shingrix (1 of 2) 02/03/2024 (Originally 08/04/2005)   INFLUENZA VACCINE  03/27/2024 (Originally 07/29/2023)   Pneumonia Vaccine 15+ Years old (1 of 2 - PCV) 11/02/2024 (Originally 08/04/1962)   Medicare Annual Wellness (AWV)  11/02/2024   Colonoscopy  04/16/2027   Hepatitis C Screening  Completed   HPV VACCINES  Aged Out    Health Maintenance  Health Maintenance Due  Topic Date Due   DTaP/Tdap/Td (1 - Tdap) Never done   DEXA SCAN  Never done   MAMMOGRAM  03/19/2022   COVID-19 Vaccine (3 - 2023-24 season) 08/29/2023    Colorectal cancer screening: Type of screening: Colonoscopy. Completed 04/15/2020. Repeat every 7 years  Mammogram status: Completed 2024. Repeat every year  Bone Density status: declines  Lung Cancer Screening: (Low Dose CT Chest recommended if Age 41-80 years, 20 pack-year currently smoking OR have quit w/in 15years.) does not qualify.   Lung Cancer Screening Referral: no  Additional Screening:  Hepatitis C Screening: does qualify; Completed 08/24/2018  Vision Screening: Recommended annual ophthalmology exams for early detection of glaucoma and other disorders of the eye. Is the patient up to date with their annual eye exam?  No  Who is the provider or what is the name of the office in which the patient attends annual eye exams? none If pt is not  established with a provider, would they like to be referred to a provider to establish care? No .   Dental Screening: Recommended annual dental exams for proper oral hygiene  Diabetic Foot Exam: n/a  Community Resource Referral / Chronic Care Management: CRR required this visit?  No   CCM required this visit?  No     Plan:     I have personally reviewed and noted the following in the patient's chart:   Medical and social history Use of alcohol, tobacco or illicit drugs  Current medications and supplements including opioid prescriptions. Patient is not currently taking opioid prescriptions. Functional ability and status Nutritional status Physical activity Advanced directives List of other physicians Hospitalizations, surgeries, and ER visits in previous 12 months Vitals Screenings to include cognitive, depression, and falls Referrals and appointments  In addition, I have reviewed and discussed with patient certain preventive protocols, quality metrics, and best practice recommendations. A written personalized care plan for preventive services as well as general preventive health recommendations were provided to patient.     Barb Merino, LPN   09/04/1190   After Visit Summary: (Pick Up) Due to this being a telephonic visit, with patients personalized plan was offered to  patient and patient has requested to Pick up at office.  Nurse Notes: none

## 2023-11-29 ENCOUNTER — Encounter: Payer: 59 | Admitting: Podiatry

## 2023-11-30 NOTE — Progress Notes (Signed)
Patient was a no-show for today's scheduled appointment.

## 2024-10-19 ENCOUNTER — Other Ambulatory Visit: Payer: Self-pay | Admitting: Family Medicine

## 2024-10-19 DIAGNOSIS — K5900 Constipation, unspecified: Secondary | ICD-10-CM

## 2024-11-08 ENCOUNTER — Ambulatory Visit: Payer: 59

## 2024-11-14 ENCOUNTER — Other Ambulatory Visit (HOSPITAL_COMMUNITY): Payer: Self-pay | Admitting: Registered Nurse

## 2024-11-14 DIAGNOSIS — R911 Solitary pulmonary nodule: Secondary | ICD-10-CM

## 2024-11-15 ENCOUNTER — Encounter (HOSPITAL_COMMUNITY): Payer: Self-pay | Admitting: Registered Nurse

## 2024-11-15 ENCOUNTER — Other Ambulatory Visit (HOSPITAL_BASED_OUTPATIENT_CLINIC_OR_DEPARTMENT_OTHER): Payer: Self-pay | Admitting: Registered Nurse

## 2024-11-15 DIAGNOSIS — E2839 Other primary ovarian failure: Secondary | ICD-10-CM

## 2024-11-17 ENCOUNTER — Other Ambulatory Visit: Payer: Self-pay | Admitting: Registered Nurse

## 2024-11-17 DIAGNOSIS — Z1231 Encounter for screening mammogram for malignant neoplasm of breast: Secondary | ICD-10-CM

## 2024-11-30 ENCOUNTER — Ambulatory Visit (HOSPITAL_BASED_OUTPATIENT_CLINIC_OR_DEPARTMENT_OTHER): Attending: Registered Nurse

## 2024-11-30 ENCOUNTER — Encounter (HOSPITAL_BASED_OUTPATIENT_CLINIC_OR_DEPARTMENT_OTHER): Payer: Self-pay
